# Patient Record
Sex: Female | Born: 1950 | Race: White | Hispanic: No | Marital: Married | Smoking: Never smoker
Health system: Southern US, Community
[De-identification: ages and names within clinical notes are randomized; demographics above are authoritative.]

## PROBLEM LIST (undated history)

## (undated) DIAGNOSIS — M8580 Other specified disorders of bone density and structure, unspecified site: Principal | ICD-10-CM

## (undated) DIAGNOSIS — J302 Other seasonal allergic rhinitis: Secondary | ICD-10-CM

## (undated) DIAGNOSIS — I10 Essential (primary) hypertension: Secondary | ICD-10-CM

## (undated) HISTORY — DX: Essential (primary) hypertension: I10

## (undated) HISTORY — DX: Other specified disorders of bone density and structure, unspecified site: M85.80

---

## 1997-08-22 ENCOUNTER — Other Ambulatory Visit: Admission: RE | Admit: 1997-08-22 | Discharge: 1997-08-22 | Payer: Self-pay | Admitting: Obstetrics and Gynecology

## 2011-02-06 ENCOUNTER — Other Ambulatory Visit (HOSPITAL_COMMUNITY)
Admission: RE | Admit: 2011-02-06 | Discharge: 2011-02-06 | Disposition: A | Discharge status: Home / Self Care | Payer: BC Managed Care – PPO | Source: Ambulatory Visit | Attending: Obstetrics and Gynecology | Admitting: Obstetrics and Gynecology

## 2011-02-06 ENCOUNTER — Other Ambulatory Visit: Payer: Self-pay | Admitting: Adult Health

## 2011-02-06 DIAGNOSIS — Z780 Asymptomatic menopausal state: Secondary | ICD-10-CM

## 2011-02-06 DIAGNOSIS — Z01419 Encounter for gynecological examination (general) (routine) without abnormal findings: Secondary | ICD-10-CM | POA: Insufficient documentation

## 2011-02-06 DIAGNOSIS — Z139 Encounter for screening, unspecified: Secondary | ICD-10-CM

## 2011-02-11 ENCOUNTER — Other Ambulatory Visit (HOSPITAL_COMMUNITY): Payer: Self-pay

## 2011-02-11 ENCOUNTER — Ambulatory Visit (HOSPITAL_COMMUNITY)
Admission: RE | Admit: 2011-02-11 | Discharge: 2011-02-11 | Disposition: A | Discharge status: Home / Self Care | Payer: BC Managed Care – PPO | Source: Ambulatory Visit | Attending: Adult Health | Admitting: Adult Health

## 2011-02-11 DIAGNOSIS — Z13820 Encounter for screening for osteoporosis: Secondary | ICD-10-CM | POA: Insufficient documentation

## 2011-02-11 DIAGNOSIS — Z780 Asymptomatic menopausal state: Secondary | ICD-10-CM

## 2011-02-11 DIAGNOSIS — Z139 Encounter for screening, unspecified: Secondary | ICD-10-CM

## 2011-03-25 ENCOUNTER — Encounter (HOSPITAL_COMMUNITY): Payer: Self-pay | Admitting: Pharmacy Technician

## 2011-03-26 ENCOUNTER — Encounter (HOSPITAL_COMMUNITY)
Admission: RE | Admit: 2011-03-26 | Discharge: 2011-03-26 | Disposition: A | Discharge status: Home / Self Care | Payer: BC Managed Care – PPO | Source: Ambulatory Visit | Attending: Podiatry | Admitting: Podiatry

## 2011-03-26 ENCOUNTER — Encounter (HOSPITAL_COMMUNITY): Payer: Self-pay

## 2011-03-26 ENCOUNTER — Other Ambulatory Visit: Payer: Self-pay

## 2011-03-26 HISTORY — DX: Other seasonal allergic rhinitis: J30.2

## 2011-03-26 LAB — BASIC METABOLIC PANEL
BUN: 17 mg/dL (ref 6–23)
Creatinine, Ser: 0.91 mg/dL (ref 0.50–1.10)
GFR calc Af Amer: 77 mL/min — ABNORMAL LOW (ref >90)
GFR calc non Af Amer: 66 mL/min — ABNORMAL LOW (ref >90)

## 2011-03-26 LAB — CBC
HCT: 43.4 % (ref 36.0–46.0)
Hemoglobin: 14.7 g/dL (ref 12.0–15.0)
MCH: 30.6 pg (ref 26.0–34.0)
MCHC: 33.9 g/dL (ref 30.0–36.0)
MCV: 90.2 fL (ref 78.0–100.0)

## 2011-03-26 LAB — DIFFERENTIAL
Basophils Relative: 1 % (ref 0–1)
Eosinophils Absolute: 0.1 10*3/uL (ref 0.0–0.7)
Eosinophils Relative: 2 % (ref 0–5)
Monocytes Absolute: 0.3 10*3/uL (ref 0.1–1.0)
Monocytes Relative: 5 % (ref 3–12)

## 2011-03-26 LAB — SURGICAL PCR SCREEN: MRSA, PCR: NEGATIVE

## 2011-03-26 NOTE — Patient Instructions (Signed)
[address] Rose Palmer  03/26/2011   Your procedure is scheduled on:  3.21.13  Report to [facility] at [place] AM.  Call this number if you have problems the morning of surgery: [phone]   Remember:   Do not eat food:After Midnight.  May have clear liquids:until Midnight .  Clear liquids include soda, tea, black coffee, apple or grape juice, broth.  Take these medicines the morning of surgery with A SIP OF WATER: ativan if needed   Do not wear jewelry, make-up or nail polish.  Do not wear lotions, powders, or perfumes. You may wear deodorant.  Do not shave 48 hours prior to surgery.  Do not bring valuables to the hospital.  Contacts, dentures or bridgework may not be worn into surgery.  Leave suitcase in the car. After surgery it may be brought to your room.  For patients admitted to the hospital, checkout time is 11:00 AM the day of discharge.   Patients discharged the day of surgery will not be allowed to drive home.  Name and phone number of your driver: family  Special Instructions: CHG Shower Use Special Wash: 1/2 bottle night before surgery and 1/2 bottle morning of surgery.   Please read over the following fact sheets that you were given: Pain Booklet, MRSA Information, Surgical Site Infection Prevention, Anesthesia Post-op Instructions and Care and Recovery After Surgery   PATIENT INSTRUCTIONS POST-ANESTHESIA  IMMEDIATELY FOLLOWING SURGERY:  Do not drive or operate machinery for the first twenty four hours after surgery.  Do not make any important decisions for twenty four hours after surgery or while taking narcotic pain medications or sedatives.  If you develop intractable nausea and vomiting or a severe headache please notify your doctor immediately.  FOLLOW-UP:  Please make an appointment with your surgeon as instructed. You do not need to follow up with anesthesia unless specifically instructed to do so.  WOUND CARE INSTRUCTIONS (if applicable):  Keep a dry clean dressing on the  anesthesia/puncture wound site if there is drainage.  Once the wound has quit draining you may leave it open to air.  Generally you should leave the bandage intact for twenty four hours unless there is drainage.  If the epidural site drains for more than 36-48 hours please call the anesthesia department.  QUESTIONS?:  Please feel free to call your physician or the hospital operator if you have any questions, and they will be happy to assist you.     [facility] Anesthesia Department [address] [place] [phone]

## 2011-03-27 NOTE — H&P (Signed)
Chief Complaint / History of Present Illness: This 61 year old female returns today for a consultation to discusses the proposed surgery.  She is scheduled for bunionectomy of the left foot, Weil osteotomy of the 2nd metatarsal bone of the left foot and hammertoe repair of the 2nd digit of the left foot on 03/28/2011 at [facility].  Past Medical History:  Dermatologic Hx: (+) granuloma annulare.   Musculoskeletal Hx: (+) osteopenia.   Medication History: Active: calcium (active), Aspirin 81 mg (active), fish oil (active).   Allergies: No known medical allergies Past Surgical History: No previous surgeries. Social History: Patient/Guardian admits alcohol use. Drinking is described as social, Patient/Guardian denies illegal drug use, Patient/Guardian denies tobacco use.   Family History: Patient/Guardian admits a family history of diabetes associated with father, paternal grandmother, heart attack associated with mother, CHF associated with father.   Review of Systems: No abnormal findings with the exception of the chief complaint.    Physical Exam: The patient is a pleasant, 61 year old female in no apparent distress.  She is alert and oriented in no acute distress.  Skin of both feet is warm dry and supple.  No open lesions are present.  Nails are well trimmed.  Dorsalis pedis and posterior tibial pulses are palpable bilaterally.  No edema is present.  Capillary fill is brisk.  Muscle tone is normal.  Muscle strength is 5 out of 5 with regard to dorsiflexion plantar flexion inversion eversion of both feet.  There is lateral deviation of both great toes with dorsal medial prominence first metatarsal head bilaterally.  There is  lateral deviation of the second digit bilaterally.  First metatarsophalangeal joint range of motion is decreased with regards to dorsiflexion bilaterally.  There is pain with palpation on the plantar aspect of the second metatarsal head bilaterally.  Light touch sensation  is grossly intact.  Test Results:  None to report at this time.    Impression:  Hallux valgus.  Hammertoe deformity.  Elongated second metatarsal.  Plan:   The podiatric pathology and treatment options were again reviewed with the her.  I discussed with the her the surgical procedure itself, the indications, the risks, possible complications, post-operative course and alternative treatments. I gave no guarantees regarding the outcome. She would like to proceed with the proposed surgery.  An informed consent was obtained.  She is to return for her post-operative appointment or sooner if problems arise.  Prescriptions: Rx: Phenergan- 12.5 mg tablet , Take 1 tablet by mouth every four to six hours as needed. Max daily dose: 6.  Dispense: 21 tablet. Refills: 1.  Allow Generic: Yes Rx: Percocet- 325 mg-10mg  tablet , Take 1 tablet by mouth every six hours as needed for pain. Max daily dose: 4.  Dispense: 56 tablet. Refills: 0.  Allow Generic: Yes  cc: Primary Care Physician:  Dwana Melena, MD

## 2011-03-28 ENCOUNTER — Encounter (HOSPITAL_COMMUNITY)
Admission: RE | Disposition: A | Discharge status: Home / Self Care | Payer: Self-pay | Source: Ambulatory Visit | Attending: Podiatry

## 2011-03-28 ENCOUNTER — Ambulatory Visit (HOSPITAL_COMMUNITY): Payer: BC Managed Care – PPO

## 2011-03-28 ENCOUNTER — Ambulatory Visit (HOSPITAL_COMMUNITY)
Admission: RE | Admit: 2011-03-28 | Discharge: 2011-03-28 | Disposition: A | Discharge status: Home / Self Care | Payer: BC Managed Care – PPO | Source: Ambulatory Visit | Attending: Podiatry | Admitting: Podiatry

## 2011-03-28 ENCOUNTER — Encounter (HOSPITAL_COMMUNITY): Payer: Self-pay | Admitting: Anesthesiology

## 2011-03-28 ENCOUNTER — Ambulatory Visit (HOSPITAL_COMMUNITY): Payer: BC Managed Care – PPO | Admitting: Anesthesiology

## 2011-03-28 DIAGNOSIS — Z01810 Encounter for preprocedural cardiovascular examination: Secondary | ICD-10-CM | POA: Insufficient documentation

## 2011-03-28 DIAGNOSIS — M2040 Other hammer toe(s) (acquired), unspecified foot: Secondary | ICD-10-CM | POA: Insufficient documentation

## 2011-03-28 DIAGNOSIS — Q799 Congenital malformation of musculoskeletal system, unspecified: Secondary | ICD-10-CM | POA: Insufficient documentation

## 2011-03-28 DIAGNOSIS — Z01812 Encounter for preprocedural laboratory examination: Secondary | ICD-10-CM | POA: Insufficient documentation

## 2011-03-28 DIAGNOSIS — M2010 Hallux valgus (acquired), unspecified foot: Secondary | ICD-10-CM | POA: Insufficient documentation

## 2011-03-28 HISTORY — PX: BUNIONECTOMY: SHX129

## 2011-03-28 HISTORY — PX: HAMMER TOE SURGERY: SHX385

## 2011-03-28 HISTORY — PX: WEIL OSTEOTOMY: SHX5044

## 2011-03-28 SURGERY — BUNIONECTOMY
Anesthesia: Monitor Anesthesia Care | Site: Foot | Laterality: Left | Wound class: Clean

## 2011-03-28 MED ORDER — ONDANSETRON HCL 4 MG/2ML IJ SOLN: 4.0000 mg | Freq: Once | INTRAMUSCULAR | Status: DC | PRN

## 2011-03-28 MED ORDER — MIDAZOLAM HCL 2 MG/2ML IJ SOLN
INTRAMUSCULAR | Status: AC
  Administered 2011-03-28: 2 mg via INTRAVENOUS
  Filled 2011-03-28: qty 2

## 2011-03-28 MED ORDER — MIDAZOLAM HCL 5 MG/5ML IJ SOLN
INTRAMUSCULAR | Status: DC | PRN
  Administered 2011-03-28: 2 mg via INTRAVENOUS

## 2011-03-28 MED ORDER — CEFAZOLIN SODIUM 1-5 GM-% IV SOLN: 1.0000 g | INTRAVENOUS | Status: DC

## 2011-03-28 MED ORDER — CEFAZOLIN SODIUM 1-5 GM-% IV SOLN
INTRAVENOUS | Status: DC | PRN
  Administered 2011-03-28: 1 g via INTRAVENOUS

## 2011-03-28 MED ORDER — MIDAZOLAM HCL 2 MG/2ML IJ SOLN
INTRAMUSCULAR | Status: AC
  Filled 2011-03-28: qty 2

## 2011-03-28 MED ORDER — MIDAZOLAM HCL 2 MG/2ML IJ SOLN
1.0000 mg | INTRAMUSCULAR | Status: DC | PRN
  Administered 2011-03-28: 2 mg via INTRAVENOUS

## 2011-03-28 MED ORDER — PROPOFOL 10 MG/ML IV EMUL
INTRAVENOUS | Status: AC
  Filled 2011-03-28: qty 20

## 2011-03-28 MED ORDER — FENTANYL CITRATE 0.05 MG/ML IJ SOLN
INTRAMUSCULAR | Status: DC | PRN
  Administered 2011-03-28 (×2): 25 ug via INTRAVENOUS
  Administered 2011-03-28: 50 ug via INTRAVENOUS

## 2011-03-28 MED ORDER — BUPIVACAINE HCL (PF) 0.5 % IJ SOLN
INTRAMUSCULAR | Status: AC
  Filled 2011-03-28: qty 30

## 2011-03-28 MED ORDER — LIDOCAINE HCL (PF) 1 % IJ SOLN
INTRAMUSCULAR | Status: AC
  Filled 2011-03-28: qty 5

## 2011-03-28 MED ORDER — FENTANYL CITRATE 0.05 MG/ML IJ SOLN: 25.0000 ug | INTRAMUSCULAR | Status: DC | PRN

## 2011-03-28 MED ORDER — FENTANYL CITRATE 0.05 MG/ML IJ SOLN
INTRAMUSCULAR | Status: AC
  Filled 2011-03-28: qty 2

## 2011-03-28 MED ORDER — PROPOFOL 10 MG/ML IV BOLUS
INTRAVENOUS | Status: DC | PRN
  Administered 2011-03-28: 20 mg via INTRAVENOUS
  Administered 2011-03-28 (×2): 10 mg via INTRAVENOUS

## 2011-03-28 MED ORDER — SODIUM CHLORIDE 0.9 % IR SOLN
Status: DC | PRN
  Administered 2011-03-28: 1000 mL

## 2011-03-28 MED ORDER — LACTATED RINGERS IV SOLN
INTRAVENOUS | Status: DC
  Administered 2011-03-28: 07:00:00 via INTRAVENOUS

## 2011-03-28 MED ORDER — BUPIVACAINE HCL (PF) 0.5 % IJ SOLN
INTRAMUSCULAR | Status: DC | PRN
  Administered 2011-03-28: 29 mL

## 2011-03-28 MED ORDER — CEFAZOLIN SODIUM 1-5 GM-% IV SOLN
INTRAVENOUS | Status: AC
  Filled 2011-03-28: qty 50

## 2011-03-28 MED ORDER — PROPOFOL 10 MG/ML IV EMUL
INTRAVENOUS | Status: DC | PRN
  Administered 2011-03-28: 50 ug/kg/min via INTRAVENOUS

## 2011-03-28 SURGICAL SUPPLY — 67 items
APL SKNCLS STERI-STRIP NONHPOA (GAUZE/BANDAGES/DRESSINGS) ×1
BAG HAMPER (MISCELLANEOUS) ×2 IMPLANT
BANDAGE CONFORM 2  STR LF (GAUZE/BANDAGES/DRESSINGS) ×1 IMPLANT
BANDAGE ELASTIC 3 VELCRO NS (GAUZE/BANDAGES/DRESSINGS) ×1 IMPLANT
BANDAGE ELASTIC 4 VELCRO NS (GAUZE/BANDAGES/DRESSINGS) ×2 IMPLANT
BANDAGE ESMARK 4X12 BL STRL LF (DISPOSABLE) ×1 IMPLANT
BANDAGE GAUZE ELAST BULKY 4 IN (GAUZE/BANDAGES/DRESSINGS) ×2 IMPLANT
BENZOIN TINCTURE PRP APPL 2/3 (GAUZE/BANDAGES/DRESSINGS) ×2 IMPLANT
BIT DRILL 2.0 HCS 150 (BIT) IMPLANT
BIT DRILL MICR ACTRK 2 LNG PRF (BIT) IMPLANT
BLADE AVERAGE 25X9 (BLADE) ×2 IMPLANT
BLADE OSC/SAG 18.5X9 THN (BLADE) ×2 IMPLANT
BLADE OSC/SAGITTAL MD 5.5X18 (BLADE) ×2 IMPLANT
BLADE SURG 15 STRL LF DISP TIS (BLADE) ×2 IMPLANT
BLADE SURG 15 STRL SS (BLADE) ×4
BNDG CMPR 12X4 ELC STRL LF (DISPOSABLE) ×1
BNDG ESMARK 4X12 BLUE STRL LF (DISPOSABLE) ×2
CHLORAPREP W/TINT 26ML (MISCELLANEOUS) ×2 IMPLANT
CLOTH BEACON ORANGE TIMEOUT ST (SAFETY) ×2 IMPLANT
COVER SURGICAL LIGHT HANDLE (MISCELLANEOUS) ×4 IMPLANT
CUFF TOURNIQUET SINGLE 18IN (TOURNIQUET CUFF) ×1 IMPLANT
DECANTER SPIKE VIAL GLASS SM (MISCELLANEOUS) ×2 IMPLANT
DRAPE OEC MINIVIEW 54X84 (DRAPES) ×2 IMPLANT
DRILL MICRO ACUTRAK 2 LNG PROF (BIT) ×2
DRSG ADAPTIC 3X8 NADH LF (GAUZE/BANDAGES/DRESSINGS) ×2 IMPLANT
DURA STEPPER LG (CAST SUPPLIES) IMPLANT
DURA STEPPER MED (CAST SUPPLIES) ×1 IMPLANT
DURA STEPPER SML (CAST SUPPLIES) IMPLANT
DURA STEPPER XL (SOFTGOODS) IMPLANT
ELECT REM PT RETURN 9FT ADLT (ELECTROSURGICAL) ×2
ELECTRODE REM PT RTRN 9FT ADLT (ELECTROSURGICAL) ×1 IMPLANT
GLOVE BIO SURGEON STRL SZ7.5 (GLOVE) ×2 IMPLANT
GLOVE ECLIPSE 6.5 STRL STRAW (GLOVE) ×1 IMPLANT
GLOVE ECLIPSE 7.0 STRL STRAW (GLOVE) ×1 IMPLANT
GLOVE EXAM NITRILE MD LF STRL (GLOVE) ×1 IMPLANT
GLOVE INDICATOR 7.0 STRL GRN (GLOVE) ×1 IMPLANT
GOWN STRL REIN XL XLG (GOWN DISPOSABLE) ×6 IMPLANT
GUIDEWIRE ORTHO MICROSHT  ACUT (WIRE) ×1
GUIDEWIRE ORTHO MICROSHT .035 (WIRE) IMPLANT
K-WIRE 6 (WIRE)
K-WIRE DBL PT .062 (WIRE) ×1 IMPLANT
KIT ROOM TURNOVER AP CYSTO (KITS) ×2 IMPLANT
KIT ROOM TURNOVER APOR (KITS) ×2 IMPLANT
KWIRE 6 (WIRE) IMPLANT
MANIFOLD NEPTUNE II (INSTRUMENTS) ×2 IMPLANT
NDL HYPO 27GX1-1/4 (NEEDLE) ×3 IMPLANT
NEEDLE HYPO 27GX1-1/4 (NEEDLE) ×6 IMPLANT
NS IRRIG 1000ML POUR BTL (IV SOLUTION) ×2 IMPLANT
PACK BASIC LIMB (CUSTOM PROCEDURE TRAY) ×2 IMPLANT
PAD ARMBOARD 7.5X6 YLW CONV (MISCELLANEOUS) ×2 IMPLANT
PAIN PUMP ON-Q 100MLX2ML 2.5IN (PAIN MANAGEMENT) IMPLANT
PIN CAPS ORTHO GREEN .062 (PIN) IMPLANT
RASP SM TEAR CROSS CUT (RASP) ×1 IMPLANT
SCREW BONE ACUTRAK 2 MICRO 20M (Orthopedic Implant) ×1 IMPLANT
SCREW LOCKING 2.0X10 (Screw) ×1 IMPLANT
SET BASIN LINEN APH (SET/KITS/TRAYS/PACK) ×2 IMPLANT
SPONGE GAUZE 4X4 12PLY (GAUZE/BANDAGES/DRESSINGS) ×2 IMPLANT
STRIP CLOSURE SKIN 1/2X4 (GAUZE/BANDAGES/DRESSINGS) ×3 IMPLANT
STRIP CLOSURE SKIN 1/8X3 (GAUZE/BANDAGES/DRESSINGS) ×6 IMPLANT
SUT MON AB 5-0 PS2 18 (SUTURE) ×2 IMPLANT
SUT PROLENE 4 0 PS 2 18 (SUTURE) ×1 IMPLANT
SUT VIC AB 2-0 CT2 27 (SUTURE) ×1 IMPLANT
SUT VIC AB 4-0 PS2 27 (SUTURE) ×2 IMPLANT
SUT VICRYL AB 3-0 FS1 BRD 27IN (SUTURE) ×2 IMPLANT
SYR BULB IRRIGATION 50ML (SYRINGE) ×2 IMPLANT
SYR CONTROL 10ML LL (SYRINGE) ×5 IMPLANT
TOWEL OR 17X26 4PK STRL BLUE (TOWEL DISPOSABLE) ×1 IMPLANT

## 2011-03-28 NOTE — Brief Op Note (Signed)
OPERATIVE REPORT  SURGEON:   Dallas Schimke, DPM  OR STAFF:   Cyndie Chime, RN - Circulator Maggie Lucile Crater, CST - Scrub Person Lennox Pippins, RN - RN First Assistant   PREOPERATIVE DIAGNOSIS:   1. Hallux valgus left foot 2. Elongated / plantarflexed 2nd metatarsal left foot 3. Hammertoe deformity 2nd digit left foot  POSTOPERATIVE DIAGNOSIS: 1. Hallux valgus left foot 2. Elongated / plantarflexed 2nd metatarsal left foot 3. Hammertoe deformity 2nd digit left foot  PROCEDURE: 1. Austin bunionectomy left foot 2. Weil osteotomy 2nd metatarsal left foot  ANESTHESIA:  Monitor Anesthesia Care   HEMOSTASIS:    Total Tourniquet Time Documented: Calf (Left) - 92 minutes; Tourniquet set at 250 mmHg  ESTIMATED BLOOD LOSS:   Minimal (<5cc)  MATERIALS USED:  Accumed Micro Acutrak 2 Bone Screw x 20mm Synthes Screw 2.0 x 10mm   INJECTABLES: Marcaine 0.5% plain; 30mL  PATHOLOGY:   None  COMPLICATIONS:   None  INDICATIONS:  Painful bunion deformity of the left foot and elongated 2nd metatarsal of the left foot  DICTATION:  Other Dictation: Dictation Number [id number]

## 2011-03-28 NOTE — Anesthesia Postprocedure Evaluation (Signed)
  Anesthesia Post-op Note  Patient: Rose Palmer  Procedure(s) Performed: Procedure(s) (LRB): BUNIONECTOMY (Left) WEIL OSTEOTOMY (Left) HAMMER TOE CORRECTION (Left)  Patient Location: PACU  Anesthesia Type: MAC  Level of Consciousness: awake, alert  and oriented  Airway and Oxygen Therapy: Patient Spontanous Breathing  Post-op Pain: none  Post-op Assessment: Post-op Vital signs reviewed, Patient's Cardiovascular Status Stable, Respiratory Function Stable and Patent Airway  Post-op Vital Signs: Reviewed and stable  Complications: No apparent anesthesia complications

## 2011-03-28 NOTE — Anesthesia Preprocedure Evaluation (Signed)
Anesthesia Evaluation  Patient identified by MRN, date of birth, ID band Patient awake    Reviewed: Allergy & Precautions, H&P , NPO status , Patient's Chart, lab work & pertinent test results  History of Anesthesia Complications Negative for: history of anesthetic complications  Airway Mallampati: II      Dental  (+) Teeth Intact   Pulmonary neg pulmonary ROS,  breath sounds clear to auscultation        Cardiovascular negative cardio ROS  Rhythm:Regular Rate:Normal     Neuro/Psych    GI/Hepatic negative GI ROS,   Endo/Other    Renal/GU      Musculoskeletal   Abdominal   Peds  Hematology   Anesthesia Other Findings   Reproductive/Obstetrics                           Anesthesia Physical Anesthesia Plan  ASA: I  Anesthesia Plan: MAC   Post-op Pain Management:    Induction: Intravenous  Airway Management Planned: Nasal Cannula  Additional Equipment:   Intra-op Plan:   Post-operative Plan:   Informed Consent: I have reviewed the patients History and Physical, chart, labs and discussed the procedure including the risks, benefits and alternatives for the proposed anesthesia with the patient or authorized representative who has indicated his/her understanding and acceptance.     Plan Discussed with:   Anesthesia Plan Comments:         Anesthesia Quick Evaluation

## 2011-03-28 NOTE — Transfer of Care (Signed)
Immediate Anesthesia Transfer of Care Note  Patient: Rose Palmer  Procedure(s) Performed: Procedure(s) (LRB): BUNIONECTOMY (Left) WEIL OSTEOTOMY (Left) HAMMER TOE CORRECTION (Left)  Patient Location: PACU  Anesthesia Type: MAC  Level of Consciousness: awake, alert  and oriented  Airway & Oxygen Therapy: Patient Spontanous Breathing  Post-op Assessment: Report given to PACU RN  Post vital signs: Reviewed and stable  Complications: No apparent anesthesia complications

## 2011-03-28 NOTE — H&P (Signed)
HISTORY AND PHYSICAL INTERVAL NOTE:  03/28/2011  7:25 AM  Rose Palmer  has presented today for surgery, with the diagnosis of hallux valgus, elongated / plantarflexed 2nd metatarsal left foot.  The various methods of treatment have been discussed with the patient.  No guarantees were given.  After consideration of risks, benefits and other options for treatment, the patient has consented to surgery.  I have reviewed the patients' chart and labs.    Patient Vitals for the past 24 hrs:  BP Temp Temp src Resp SpO2  03/28/11 0715 128/85 mmHg - - 15  98 %  03/28/11 0700 129/87 mmHg - - 26  98 %  03/28/11 0647 128/81 mmHg 98.2 F (36.8 C) Oral 17  100 %    A history and physical examination was performed in my office on 03/26/2011.  The patient was reexamined.  There have been no changes to this history and physical examination.  Dallas Schimke, DPM

## 2011-03-28 NOTE — Addendum Note (Signed)
Addendum  created 03/28/11 0948 by Franco Nones, CRNA   Modules edited:Charges VN

## 2011-03-30 NOTE — Op Note (Signed)
NAMEPRIYAL, Rose Palmer                   ACCOUNT NO.:  [id number]  MEDICAL RECORD NO.:  [id number]  LOCATION:  [facility]                          FACILITY:  [facility]  PHYSICIAN:  B. Theola Sequin, MD   DATE OF BIRTH:  1950/10/05  DATE OF PROCEDURE: DATE OF DISCHARGE:  03/28/2011                              OPERATIVE REPORT   SURGEON:  B. Theola Sequin, MD  ASSISTANT:  Valetta Close, RN  PREOPERATIVE DIAGNOSES: 1. Hallux valgus, left foot. 2. Elongated/plantar flexed second metatarsal, left foot. 3. Hammertoe deformity, second digit, left foot.  POSTOPERATIVE DIAGNOSES: 1. Hallux valgus, left foot. 2. Elongated/plantar flexed second metatarsal, left foot. 3. Hammertoe deformity, second digit, left foot.  PROCEDURE: 1. Austin bunionectomy, left foot. 2. Weil osteotomy, second metatarsal, left foot.  ANESTHESIA:  MAC with local.  HEMOSTASIS:  Pneumatic ankle tourniquet at 250 mmHg.  ESTIMATED BLOOD LOSS:  Minimal (less than 5 mL, injectable 0.5% Marcaine plain).  PATHOLOGY:  None.  COMPLICATIONS:  None.  PROCEDURE IN DETAIL:  The patient was brought to the operating room and placed on the operative table in the supine position.  The pneumatic ankle tourniquet was placed about the patient's left ankle.  The foot was then anesthetized using 0.5% Marcaine plain.  The foot was scrubbed, prepped and draped in the usual sterile manner.  The limb was then elevated, exsanguinated and the pneumatic ankle tourniquet inflated to 250 mmHg.  Attention was directed to the dorsomedial aspect of the left foot, where a linear longitudinal incision was made medial and parallel to the extensor hallucis longus tendon and involved the contour of the deformity.  The incision was deepened through the subcutaneous tissue down to the level of the first metatarsophalangeal joint.  A linear longitudinal periosteal and capsular incision was then performed.  The periosteal and capsular structures were  reflected medially and laterally, thus exposing the head of the first metatarsal at the operative site.  The prominent medial eminence of the first metatarsal head was resected.  Attention was directed to the first interspace via the original skin incision, where the tendon of the adductor hallucis muscle was identified and transected at its attachment to the base of the proximal phalanx.  Attention was redirected to the medial aspect of the first metatarsal head, where a chevron osteotomy was performed through the first metatarsal bone, the capital fragment was distracted and shifted into more corrected lateral position and impacted upon the first metatarsal shaft.  Following standard principles and techniques, an Acumed micro Acutrak 2 bone screw, 20 mm in length was inserted across the osteotomy site with adequate compression noted.  Position of the screw was confirmed with C-arm fluoroscopy.  The remaining medial bone shelf was resected and passed from the operative field.  The medial sesamoid was noted to be extremely prominent medially after translation of the capital fragment and it was determined that this should be removed to avoid postoperative complications.  The tibial sesamoid was freed of its soft tissue attachments and removed and passed from the operative field.  The wound was irrigated with copious amount of sterile irrigant.  The periosteal and capsular structure reapproximated using 3- 0  Vicryl and medial capsulorrhaphy was performed using 2-0 Vicryl. Subcutaneous structure reapproximated using 4-0 Vicryl and skin was reapproximated using 5-0 Monocryl in a running, subcuticular manner and reinforced with Prolene and Steri-Strips.  Attention was then directed to the dorsal aspect of the second toe, where a linear longitudinal incision was made.  Dissection was continued deep down to level of the second metatarsophalangeal joint.  The linear longitudinal periosteal  capsular incision was then performed.  The periosteal and capsular structure reflected medially and laterally thus exposing the head of the second metatarsal at the operative site.  A Weil osteotomy was performed and fixated using a 2.0 Synthes screw.  The wound was irrigated with copious amounts of sterile irrigant.  The periosteal capsular structure reapproximated using 4-0 Vicryl.  Skin was reapproximated using 5-0 Monocryl in a running subcuticular manner, reinforced with 4-0 Prolene and Steri-Strips.  Sterile compressive dressing was then applied to the operative foot.  The pneumatic ankle tourniquet was deflated and a prompt hyperemic response was noted to all digits of the operative foot.          ______________________________ B. Theola Sequin, MD     BIM/MEDQ  D:  03/29/2011  T:  03/30/2011  Job:  [id number]

## 2011-04-19 ENCOUNTER — Encounter (HOSPITAL_COMMUNITY): Payer: Self-pay | Admitting: Podiatry

## 2012-02-24 ENCOUNTER — Other Ambulatory Visit: Payer: Self-pay | Admitting: Adult Health

## 2012-02-24 ENCOUNTER — Other Ambulatory Visit (HOSPITAL_COMMUNITY)
Admission: RE | Admit: 2012-02-24 | Discharge: 2012-02-24 | Disposition: A | Discharge status: Home / Self Care | Payer: BC Managed Care – PPO | Source: Ambulatory Visit | Attending: Obstetrics and Gynecology | Admitting: Obstetrics and Gynecology

## 2012-02-24 DIAGNOSIS — Z1151 Encounter for screening for human papillomavirus (HPV): Secondary | ICD-10-CM | POA: Insufficient documentation

## 2012-02-24 DIAGNOSIS — Z01419 Encounter for gynecological examination (general) (routine) without abnormal findings: Secondary | ICD-10-CM | POA: Insufficient documentation

## 2013-01-25 IMAGING — CR DG FOOT COMPLETE 3+V*L*
3 series · 3 of 3 positions shown · non-contrast
Comparison: None.

CLINICAL DATA: Postop bunionectomy and osteotomy.

LEFT FOOT - COMPLETE 3+ VIEW

[view not recorded (1 of 3)]
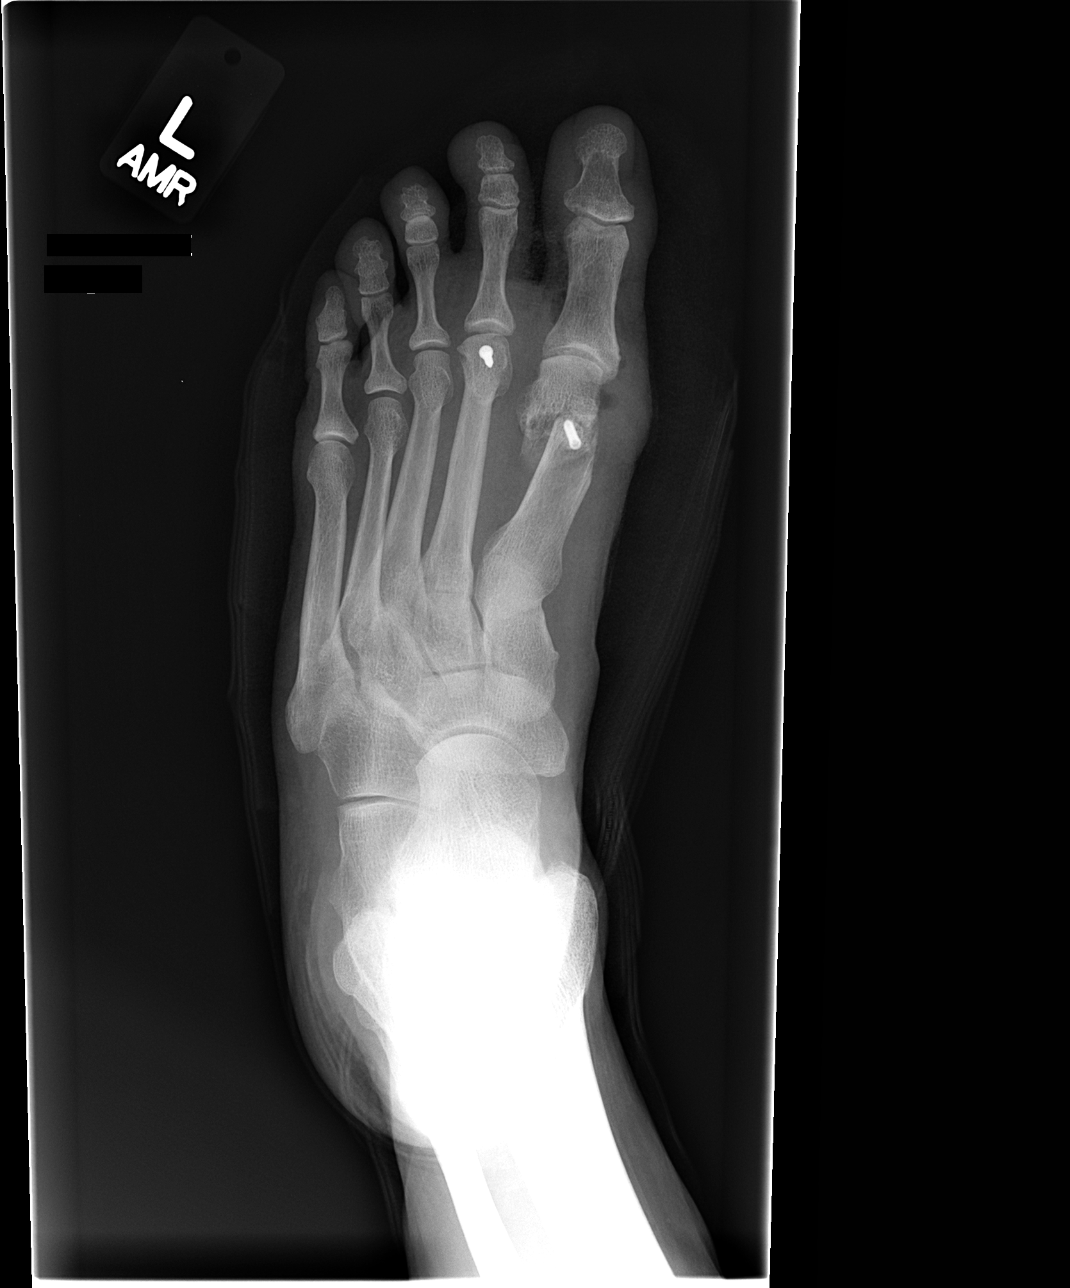

[view not recorded (2 of 3)]
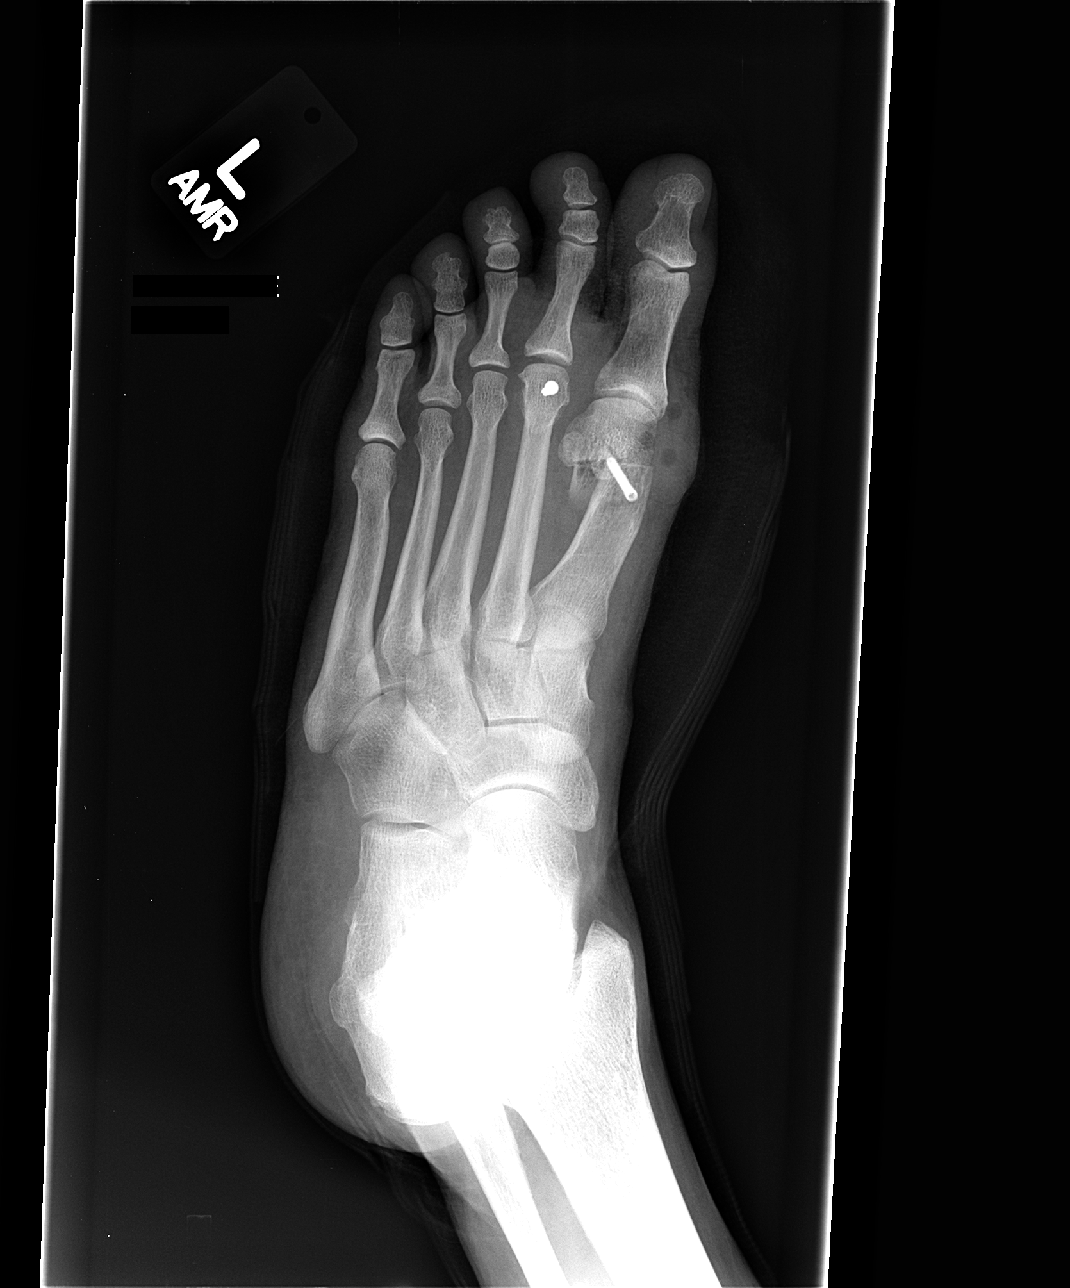

[view not recorded (3 of 3)]
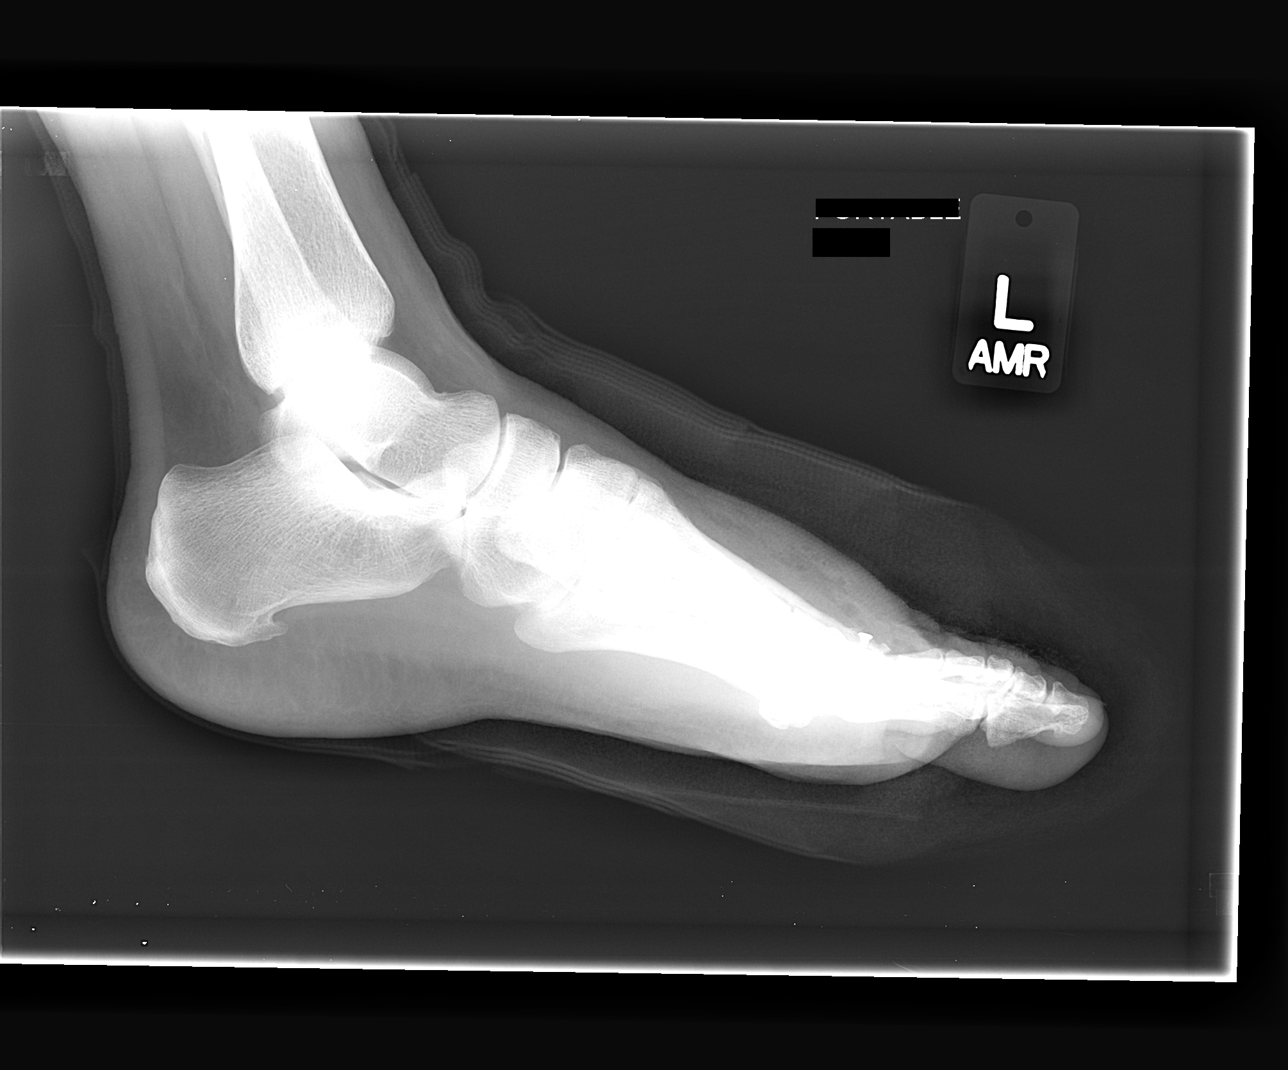

[3 of 3 positions shown; findings below may reference images not displayed]

FINDINGS: Postoperative changes are seen in the distal first
metatarsal, with surrounding soft tissue edema and subcutaneous
emphysema. Mild first metatarsal phalangeal joint osteoarthritis.
Postoperative changes in the head of the second metatarsal appear
remote.  Calcaneal spur is noted.
IMPRESSION: 1.  Postoperative changes in the first metatarsal with expected
postoperative findings of soft tissue swelling and subcutaneous
emphysema.
2.  Postoperative changes in the second metatarsal head appear
remote.
3.  Mild first metatarsal phalangeal joint osteoarthritis.

## 2013-03-02 ENCOUNTER — Other Ambulatory Visit: Payer: Self-pay | Admitting: Adult Health

## 2013-04-01 ENCOUNTER — Encounter (INDEPENDENT_AMBULATORY_CARE_PROVIDER_SITE_OTHER): Payer: Self-pay

## 2013-04-01 ENCOUNTER — Ambulatory Visit (INDEPENDENT_AMBULATORY_CARE_PROVIDER_SITE_OTHER): Payer: BC Managed Care – PPO | Admitting: Adult Health

## 2013-04-01 ENCOUNTER — Encounter: Payer: Self-pay | Admitting: Adult Health

## 2013-04-01 VITALS — BP 120/80 | HR 72 | Ht 65.0 in | Wt 121.0 lb

## 2013-04-01 DIAGNOSIS — Z01419 Encounter for gynecological examination (general) (routine) without abnormal findings: Secondary | ICD-10-CM

## 2013-04-01 DIAGNOSIS — Z1212 Encounter for screening for malignant neoplasm of rectum: Secondary | ICD-10-CM

## 2013-04-01 LAB — HEMOCCULT GUIAC POC 1CARD (OFFICE): Fecal Occult Blood, POC: NEGATIVE

## 2013-04-01 NOTE — Progress Notes (Signed)
Patient ID: Rose Palmer, female   DOB: Jun 12, 1950, 63 y.o.   MRN: [id number] History of Present Illness: Rose Palmer is a 63 year old white female, married in for a physical.No complaints.Had normal pap with negative HPV 02/24/12.   Current Medications, Allergies, Past Medical History, Past Surgical History, Family History and Social History were reviewed in [employer] record.     Review of Systems: Patient denies any headaches, blurred vision, shortness of breath, chest pain, abdominal pain, problems with bowel movements, urination, or intercourse. No joint pain or mood swings.    Physical Exam:BP 120/80  Pulse 72  Ht 5\' 5"  (1.651 m)  Wt 121 lb (54.885 kg)  BMI 20.14 kg/m2 General:  Well developed, well nourished, no acute distress Skin:  Warm and dry Neck:  Midline trachea, normal thyroid Lungs; Clear to auscultation bilaterally Breast:  No dominant palpable mass, retraction, or nipple discharge Cardiovascular: Regular rate and rhythm Abdomen:  Soft, non tender, no hepatosplenomegaly Pelvic:  External genitalia is normal in appearance.  The vagina is normal in appearance.  The cervix is atrophic.  Uterus is felt to be normal size, shape, and contour.  No adnexal masses or tenderness noted. Rectal: Good sphincter tone, no polyps, or hemorrhoids felt.  Hemoccult negative. Extremities:  No swelling or varicosities noted Psych:  No mood changes, alert and cooperative, seems happy   Impression: Yearly gyn exam in PM female    Plan: Physical in 1 year Mammogram yearly Colonoscopy per GI Labs with PCP Get pneumonia and shingles shots-PCP

## 2013-04-01 NOTE — Patient Instructions (Signed)
Physical in 1 year Mammogram yearly  colonoscopy as per GI Labs with PCP Get pneumonia shot and shingles shot

## 2013-05-26 DIAGNOSIS — H524 Presbyopia: Secondary | ICD-10-CM | POA: Diagnosis not present

## 2013-10-13 DIAGNOSIS — Z23 Encounter for immunization: Secondary | ICD-10-CM | POA: Diagnosis not present

## 2013-10-15 DIAGNOSIS — I10 Essential (primary) hypertension: Secondary | ICD-10-CM | POA: Diagnosis not present

## 2013-11-08 ENCOUNTER — Encounter: Payer: Self-pay | Admitting: Adult Health

## 2014-03-04 ENCOUNTER — Other Ambulatory Visit: Payer: Self-pay | Admitting: Adult Health

## 2014-04-08 DIAGNOSIS — Z1231 Encounter for screening mammogram for malignant neoplasm of breast: Secondary | ICD-10-CM | POA: Diagnosis not present

## 2014-04-12 DIAGNOSIS — Z1231 Encounter for screening mammogram for malignant neoplasm of breast: Secondary | ICD-10-CM | POA: Diagnosis not present

## 2014-04-12 DIAGNOSIS — R922 Inconclusive mammogram: Secondary | ICD-10-CM | POA: Diagnosis not present

## 2014-05-03 ENCOUNTER — Encounter: Payer: Self-pay | Admitting: Adult Health

## 2014-07-04 ENCOUNTER — Other Ambulatory Visit: Payer: Self-pay

## 2014-10-17 DIAGNOSIS — N6049 Mammary duct ectasia of unspecified breast: Secondary | ICD-10-CM | POA: Diagnosis not present

## 2014-10-19 DIAGNOSIS — Z23 Encounter for immunization: Secondary | ICD-10-CM | POA: Diagnosis not present

## 2014-11-01 ENCOUNTER — Encounter: Payer: Self-pay | Admitting: Adult Health

## 2014-11-01 DIAGNOSIS — H2513 Age-related nuclear cataract, bilateral: Secondary | ICD-10-CM | POA: Diagnosis not present

## 2014-11-16 DIAGNOSIS — Z23 Encounter for immunization: Secondary | ICD-10-CM | POA: Diagnosis not present

## 2015-03-07 ENCOUNTER — Other Ambulatory Visit: Payer: Self-pay | Admitting: Adult Health

## 2015-03-20 ENCOUNTER — Other Ambulatory Visit: Payer: Self-pay | Admitting: Adult Health

## 2015-05-16 DIAGNOSIS — M8589 Other specified disorders of bone density and structure, multiple sites: Secondary | ICD-10-CM | POA: Diagnosis not present

## 2015-05-16 DIAGNOSIS — R922 Inconclusive mammogram: Secondary | ICD-10-CM | POA: Diagnosis not present

## 2015-05-16 DIAGNOSIS — Z09 Encounter for follow-up examination after completed treatment for conditions other than malignant neoplasm: Secondary | ICD-10-CM | POA: Diagnosis not present

## 2015-05-24 ENCOUNTER — Telehealth: Payer: Self-pay | Admitting: Adult Health

## 2015-05-24 ENCOUNTER — Encounter: Payer: Self-pay | Admitting: Adult Health

## 2015-05-24 DIAGNOSIS — M8580 Other specified disorders of bone density and structure, unspecified site: Secondary | ICD-10-CM

## 2015-05-24 HISTORY — DX: Other specified disorders of bone density and structure, unspecified site: M85.80

## 2015-05-24 NOTE — Telephone Encounter (Signed)
Pt aware DEXA showed osteopenia, continue current treatment and repeat DEXA in 2 years, get pap and physical sometime this year, if desired.

## 2015-05-25 ENCOUNTER — Encounter: Payer: Self-pay | Admitting: Adult Health

## 2015-05-25 DIAGNOSIS — I10 Essential (primary) hypertension: Secondary | ICD-10-CM | POA: Diagnosis not present

## 2015-05-30 DIAGNOSIS — M8580 Other specified disorders of bone density and structure, unspecified site: Secondary | ICD-10-CM | POA: Diagnosis not present

## 2015-05-30 DIAGNOSIS — H9313 Tinnitus, bilateral: Secondary | ICD-10-CM | POA: Diagnosis not present

## 2015-05-31 ENCOUNTER — Encounter (INDEPENDENT_AMBULATORY_CARE_PROVIDER_SITE_OTHER): Payer: Self-pay | Admitting: *Deleted

## 2015-09-14 DIAGNOSIS — N390 Urinary tract infection, site not specified: Secondary | ICD-10-CM | POA: Diagnosis not present

## 2015-09-19 ENCOUNTER — Encounter: Payer: Self-pay | Admitting: Adult Health

## 2015-09-19 ENCOUNTER — Ambulatory Visit (INDEPENDENT_AMBULATORY_CARE_PROVIDER_SITE_OTHER): Payer: Medicare Other | Admitting: Adult Health

## 2015-09-19 ENCOUNTER — Other Ambulatory Visit (HOSPITAL_COMMUNITY)
Admission: RE | Admit: 2015-09-19 | Discharge: 2015-09-19 | Disposition: A | Discharge status: Home / Self Care | Payer: Medicare Other | Source: Ambulatory Visit | Attending: Adult Health | Admitting: Adult Health

## 2015-09-19 VITALS — BP 120/80 | HR 74 | Ht 65.0 in | Wt 123.5 lb

## 2015-09-19 DIAGNOSIS — Z01419 Encounter for gynecological examination (general) (routine) without abnormal findings: Secondary | ICD-10-CM

## 2015-09-19 DIAGNOSIS — Z124 Encounter for screening for malignant neoplasm of cervix: Secondary | ICD-10-CM

## 2015-09-19 DIAGNOSIS — Z01411 Encounter for gynecological examination (general) (routine) with abnormal findings: Secondary | ICD-10-CM | POA: Diagnosis not present

## 2015-09-19 DIAGNOSIS — M8580 Other specified disorders of bone density and structure, unspecified site: Secondary | ICD-10-CM | POA: Diagnosis not present

## 2015-09-19 DIAGNOSIS — Z1211 Encounter for screening for malignant neoplasm of colon: Secondary | ICD-10-CM | POA: Diagnosis not present

## 2015-09-19 DIAGNOSIS — Z1151 Encounter for screening for human papillomavirus (HPV): Secondary | ICD-10-CM | POA: Insufficient documentation

## 2015-09-19 LAB — HEMOCCULT GUIAC POC 1CARD (OFFICE): Fecal Occult Blood, POC: NEGATIVE

## 2015-09-19 NOTE — Patient Instructions (Signed)
Mammogram yearly Pap and physical  In 3 years Labs at PCP Dexa in 2019 and 2020

## 2015-09-19 NOTE — Progress Notes (Signed)
Patient ID: Rose Palmer, female   DOB: 1950/08/28, 65 y.o.   MRN: [id number] History of Present Illness: Rose Palmer is a 65 year old white female, married, in for a well woman gyn exam and pap. PCP is Z Hall.   Current Medications, Allergies, Past Medical History, Past Surgical History, Family History and Social History were reviewed in [employer] record.     Review of Systems: Patient denies any headaches, hearing loss, fatigue, blurred vision, shortness of breath, chest pain, abdominal pain, problems with bowel movements, urination, or intercourse(not often). No joint pain or mood swings.    Physical Exam:BP 120/80 (BP Location: Left Arm, Patient Position: Sitting, Cuff Size: Normal)   Pulse 74   Ht 5\' 5"  (1.651 m)   Wt 123 lb 8 oz (56 kg)   BMI 20.55 kg/m  General:  Well developed, well nourished, no acute distress Skin:  Warm and dry Neck:  Midline trachea, normal thyroid, good ROM, no lymphadenopathy,no carotid bruits heard Lungs; Clear to auscultation bilaterally Breast:  No dominant palpable mass, retraction, or nipple discharge Cardiovascular: Regular rate and rhythm Abdomen:  Soft, non tender, no hepatosplenomegaly Pelvic:  External genitalia is normal in appearance, no lesions.  The vagina has loss of color,moisture and rugae. Urethra has no lesions or masses. The cervix is smooth and strophic, pap with HPV performed.  Uterus is felt to be normal size, shape, and contour.  No adnexal masses or tenderness noted.Bladder is non tender, no masses felt. Rectal: Good sphincter tone, no polyps, or hemorrhoids felt.  Hemoccult negative. Extremities/musculoskeletal:  No swelling or varicosities noted, no clubbing or cyanosis Psych:  No mood changes, alert and cooperative,seems happy Discussed could stop paps at 68 if desired.  Impression: 1. Encounter for gynecological examination with Papanicolaou smear of cervix   2. Routine cervical smear   3. Osteopenia        Plan: Pap and physical in 1 year Mammogram yearly Labs with PCP Colonoscopy advised Dexa in 2019 or 2020

## 2015-09-20 LAB — CYTOLOGY - PAP

## 2015-10-23 DIAGNOSIS — Z23 Encounter for immunization: Secondary | ICD-10-CM | POA: Diagnosis not present

## 2015-11-02 ENCOUNTER — Encounter: Payer: Self-pay | Admitting: Adult Health

## 2016-02-07 ENCOUNTER — Other Ambulatory Visit (INDEPENDENT_AMBULATORY_CARE_PROVIDER_SITE_OTHER): Payer: Self-pay | Admitting: *Deleted

## 2016-02-07 DIAGNOSIS — Z1211 Encounter for screening for malignant neoplasm of colon: Secondary | ICD-10-CM | POA: Insufficient documentation

## 2016-04-10 DIAGNOSIS — R070 Pain in throat: Secondary | ICD-10-CM | POA: Diagnosis not present

## 2016-04-10 DIAGNOSIS — Z6820 Body mass index (BMI) 20.0-20.9, adult: Secondary | ICD-10-CM | POA: Diagnosis not present

## 2016-04-11 ENCOUNTER — Encounter (INDEPENDENT_AMBULATORY_CARE_PROVIDER_SITE_OTHER): Payer: Self-pay | Admitting: *Deleted

## 2016-04-11 ENCOUNTER — Telehealth (INDEPENDENT_AMBULATORY_CARE_PROVIDER_SITE_OTHER): Payer: Self-pay | Admitting: *Deleted

## 2016-04-11 MED ORDER — PEG-KCL-NACL-NASULF-NA ASC-C 100 G PO SOLR: 1.0000 | Freq: Once | ORAL | 0 refills | Status: AC

## 2016-04-11 NOTE — Telephone Encounter (Signed)
Referring MD/PCP: hall   Procedure: tcs  Reason/Indication:  screening  Has patient had this procedure before?  no  If so, when, by whom and where?    Is there a family history of colon cancer?  no  Who?  What age when diagnosed?    Is patient diabetic?   no      Does patient have prosthetic heart valve or mechanical valve?  no  Do you have a pacemaker?  no  Has patient ever had endocarditis? no  Has patient had joint replacement within last 12 months?  no  Does patient tend to be constipated or take laxatives? no  Does patient have a history of alcohol/drug use?  no  Is patient on Coumadin, Plavix and/or Aspirin? no  Medications: valsartan 160 mg daily, multi vit, calcium  Allergies: nkda  Medication Adjustment per Dr Laural Golden:   Procedure date & time: 05/09/16 at 730

## 2016-04-11 NOTE — Telephone Encounter (Signed)
agree

## 2016-04-11 NOTE — Telephone Encounter (Addendum)
Patient needs movi

## 2016-04-12 ENCOUNTER — Encounter (INDEPENDENT_AMBULATORY_CARE_PROVIDER_SITE_OTHER): Payer: Self-pay | Admitting: *Deleted

## 2016-04-15 DIAGNOSIS — R070 Pain in throat: Secondary | ICD-10-CM | POA: Diagnosis not present

## 2016-05-09 ENCOUNTER — Encounter (HOSPITAL_COMMUNITY)
Admission: RE | Disposition: A | Discharge status: Home Health | Payer: Self-pay | Source: Ambulatory Visit | Attending: Internal Medicine

## 2016-05-09 ENCOUNTER — Encounter (HOSPITAL_COMMUNITY): Payer: Self-pay | Admitting: *Deleted

## 2016-05-09 ENCOUNTER — Ambulatory Visit (HOSPITAL_COMMUNITY)
Admission: RE | Admit: 2016-05-09 | Discharge: 2016-05-09 | Disposition: A | Discharge status: Home Health | Payer: Medicare Other | Source: Ambulatory Visit | Attending: Internal Medicine | Admitting: Internal Medicine

## 2016-05-09 DIAGNOSIS — K5730 Diverticulosis of large intestine without perforation or abscess without bleeding: Secondary | ICD-10-CM | POA: Diagnosis not present

## 2016-05-09 DIAGNOSIS — K648 Other hemorrhoids: Secondary | ICD-10-CM | POA: Diagnosis not present

## 2016-05-09 DIAGNOSIS — Z79899 Other long term (current) drug therapy: Secondary | ICD-10-CM | POA: Diagnosis not present

## 2016-05-09 DIAGNOSIS — K644 Residual hemorrhoidal skin tags: Secondary | ICD-10-CM | POA: Insufficient documentation

## 2016-05-09 DIAGNOSIS — I10 Essential (primary) hypertension: Secondary | ICD-10-CM | POA: Insufficient documentation

## 2016-05-09 DIAGNOSIS — M8580 Other specified disorders of bone density and structure, unspecified site: Secondary | ICD-10-CM | POA: Insufficient documentation

## 2016-05-09 DIAGNOSIS — Z1211 Encounter for screening for malignant neoplasm of colon: Secondary | ICD-10-CM | POA: Diagnosis not present

## 2016-05-09 HISTORY — PX: COLONOSCOPY: SHX5424

## 2016-05-09 SURGERY — COLONOSCOPY
Anesthesia: Moderate Sedation

## 2016-05-09 MED ORDER — MIDAZOLAM HCL 5 MG/5ML IJ SOLN
INTRAMUSCULAR | Status: AC
  Filled 2016-05-09: qty 10

## 2016-05-09 MED ORDER — MIDAZOLAM HCL 5 MG/5ML IJ SOLN
INTRAMUSCULAR | Status: DC | PRN
  Administered 2016-05-09: 1 mg via INTRAVENOUS
  Administered 2016-05-09: 2 mg via INTRAVENOUS
  Administered 2016-05-09: 1 mg via INTRAVENOUS
  Administered 2016-05-09: 2 mg via INTRAVENOUS

## 2016-05-09 MED ORDER — SODIUM CHLORIDE 0.9 % IV SOLN
INTRAVENOUS | Status: DC
  Administered 2016-05-09: 07:00:00 via INTRAVENOUS

## 2016-05-09 MED ORDER — MEPERIDINE HCL 50 MG/ML IJ SOLN
INTRAMUSCULAR | Status: DC | PRN
Start: 2016-05-09 — End: 2016-05-09
  Administered 2016-05-09 (×2): 25 mg

## 2016-05-09 MED ORDER — MEPERIDINE HCL 50 MG/ML IJ SOLN
INTRAMUSCULAR | Status: AC
  Filled 2016-05-09: qty 1

## 2016-05-09 MED ORDER — SIMETHICONE 40 MG/0.6ML PO SUSP
ORAL | Status: DC | PRN
  Administered 2016-05-09: 08:00:00

## 2016-05-09 NOTE — H&P (Signed)
Rose Palmer is an 66 y.o. female.   Chief Complaint: Patient is here for colonoscopy. HPI: This 66 year old Caucasian female who is here for screening colonoscopy. This is patient's first exam. Patient denies abdominal pain change in bowel habits or rectal bleeding. Family history is negative for CRC.  Past Medical History:  Diagnosis Date  . Hypertension   . Osteopenia 05/24/2015   Repeat DEXA in 2 years  . Seasonal allergies    Zyrtec as needed    Past Surgical History:  Procedure Laterality Date  . BUNIONECTOMY  03/28/2011   Procedure: Lillard Anes;  Surgeon: Marcheta Grammes, DPM;  Location: [facility];  Service: Orthopedics;  Laterality: Left;  [place] Bunionectomy  . HAMMER TOE SURGERY  03/28/2011   Procedure: HAMMER TOE CORRECTION;  Surgeon: Marcheta Grammes, DPM;  Location: [facility];  Service: Orthopedics;  Laterality: Left;  Hammer Toe Repair 2nd Toe Left Foot  . WEIL OSTEOTOMY  03/28/2011   Procedure: WEIL OSTEOTOMY;  Surgeon: Marcheta Grammes, DPM;  Location: [facility];  Service: Orthopedics;  Laterality: Left;  Weil Osteotomy 2nd Metatarsal Left Foot    Family History  Problem Relation Age of Onset  . Hypertension Mother   . Heart attack Mother   . Hypertension Father   . Congestive Heart Failure Father   . Diabetes Father   . Hypertension Sister   . Cancer Daughter     skin  . Cancer Maternal Grandmother   . Diabetes Paternal Grandmother   . Hypertension Sister   . Hypertension Sister   . Hypotension Neg Hx   . Anesthesia problems Neg Hx   . Malignant hyperthermia Neg Hx   . Pseudochol deficiency Neg Hx    Social History:  reports that she has never smoked. She has never used smokeless tobacco. She reports that she drinks alcohol. She reports that she does not use drugs.  Allergies:  Allergies  Allergen Reactions  . Bactrim [Sulfamethoxazole-Trimethoprim] Other (See Comments)    Caused dizziness    Medications Prior to Admission  Medication Sig  Dispense Refill  . Calcium Carb-Cholecalciferol (CALCIUM 600+D) 600-800 MG-UNIT TABS Take 1 tablet by mouth 2 (two) times daily.    . cetirizine (ZYRTEC) 10 MG tablet Take 10 mg by mouth daily as needed. allergies    . ibuprofen (ADVIL,MOTRIN) 200 MG tablet Take 400 mg by mouth 2 (two) times daily as needed for headache or mild pain.     . Multiple Vitamin (MULITIVITAMIN WITH MINERALS) TABS Take 1 tablet by mouth daily.    . valsartan (DIOVAN) 160 MG tablet Take 160 mg by mouth every evening.     . risedronate (ACTONEL) 150 MG tablet TAKE 1 TABLET BY MOUTH EVERY MONTH (Patient not taking: Reported on 05/07/2016) 1 tablet 11    No results found for this or any previous visit (from the past 48 hour(s)). No results found.  ROS  Blood pressure 125/77, pulse 81, temperature 97.8 F (36.6 C), temperature source Oral, resp. rate 18, height 5' 5.5" (1.664 m), weight 123 lb (55.8 kg), SpO2 99 %. Physical Exam  Constitutional: She appears well-developed and well-nourished.  HENT:  Mouth/Throat: Oropharynx is clear and moist.  Eyes: Conjunctivae are normal. No scleral icterus.  Neck: No thyromegaly present.  Cardiovascular: Normal rate, regular rhythm and normal heart sounds.   No murmur heard. Respiratory: Effort normal and breath sounds normal.  GI: Soft. She exhibits no distension and no mass. There is no tenderness.  Musculoskeletal: She exhibits no  edema.  Lymphadenopathy:    She has no cervical adenopathy.  Neurological: She is alert.  Skin: Skin is warm and dry.     Assessment/Plan Average risk screening colonoscopy.  Hildred Laser, MD 05/09/2016, 7:28 AM

## 2016-05-09 NOTE — Op Note (Signed)
[facility] Patient Name: Rose Palmer Procedure Date: 05/09/2016 7:03 AM MRN: [id number] Date of Birth: 1950-07-26 Attending MD: Hildred Laser , MD CSN: [id number] Age: 65 Admit Type: Outpatient Procedure:                Colonoscopy Indications:              Screening for colorectal malignant neoplasm Providers:                Hildred Laser, MD, Otis Peak B. Sharon Seller, RN, Janeece Riggers, RN, Aram Candela Referring MD:             Delphina Cahill, MD Medicines:                Meperidine 50 mg IV, Midazolam 6 mg IV Complications:            No immediate complications. Estimated Blood Loss:     Estimated blood loss: none. Procedure:                Pre-Anesthesia Assessment:                           - Prior to the procedure, a History and Physical                            was performed, and patient medications and                            allergies were reviewed. The patient's tolerance of                            previous anesthesia was also reviewed. The risks                            and benefits of the procedure and the sedation                            options and risks were discussed with the patient.                            All questions were answered, and informed consent                            was obtained. Prior Anticoagulants: The patient has                            taken no previous anticoagulant or antiplatelet                            agents. ASA Grade Assessment: II - A patient with                            mild systemic disease. After reviewing the risks  and benefits, the patient was deemed in                            satisfactory condition to undergo the procedure.                           After obtaining informed consent, the colonoscope                            was passed under direct vision. Throughout the                            procedure, the patient's blood pressure, pulse, and                  oxygen saturations were monitored continuously. The                            [id number] ([id number]) was introduced through the                            anus and advanced to the the cecum, identified by                            appendiceal orifice and ileocecal valve. The                            colonoscopy was performed without difficulty. The                            patient tolerated the procedure well. The quality                            of the bowel preparation was adequate. The                            ileocecal valve, appendiceal orifice, and rectum                            were photographed. Scope In: 7:42:43 AM Scope Out: 8:01:17 AM Scope Withdrawal Time: 0 hours 7 minutes 15 seconds  Total Procedure Duration: 0 hours 18 minutes 34 seconds  Findings:      The perianal and digital rectal examinations were normal.      A few small-mouthed diverticula were found in the sigmoid colon.      The exam was otherwise normal throughout the examined colon.      External and internal hemorrhoids were found during retroflexion. The       hemorrhoids were small. Impression:               - Diverticulosis in the sigmoid colon.                           - External and internal hemorrhoids.                           -  No specimens collected. Moderate Sedation:      Moderate (conscious) sedation was administered by the endoscopy nurse       and supervised by the endoscopist. The following parameters were       monitored: oxygen saturation, heart rate, blood pressure, CO2       capnography and response to care. Total physician intraservice time was       24 minutes. Recommendation:           - Patient has a contact number available for                            emergencies. The signs and symptoms of potential                            delayed complications were discussed with the                            patient. Return to normal activities tomorrow.                             Written discharge instructions were provided to the                            patient.                           - High fiber diet today.                           - Continue present medications.                           - Repeat colonoscopy in 10 years for screening                            purposes. Procedure Code(s):        --- Professional ---                           [phone], Colonoscopy, flexible; diagnostic, including                            collection of specimen(s) by brushing or washing,                            when performed (separate procedure)                           99152, Moderate sedation services provided by the                            same physician or other qualified health care                            professional performing the diagnostic or  therapeutic service that the sedation supports,                            requiring the presence of an independent trained                            observer to assist in the monitoring of the                            patient's level of consciousness and physiological                            status; initial 15 minutes of intraservice time,                            patient age 59 years or older                           [phone], Moderate sedation services; each additional                            15 minutes intraservice time Diagnosis Code(s):        --- Professional ---                           Z12.11, Encounter for screening for malignant                            neoplasm of colon                           K64.8, Other hemorrhoids                           K57.30, Diverticulosis of large intestine without                            perforation or abscess without bleeding CPT copyright 2016 American Medical Association. All rights reserved. The codes documented in this report are preliminary and upon coder review may  be revised to meet current compliance  requirements. Hildred Laser, MD Hildred Laser, MD 05/09/2016 8:11:41 AM This report has been signed electronically. Number of Addenda: 0

## 2016-05-09 NOTE — Discharge Instructions (Addendum)
Resume usual medications and high fiber diet. No driving for 24 hours. Next screening exam in 10 years.   Colonoscopy, Adult, Care After This sheet gives you information about how to care for yourself after your procedure. Your health care provider may also give you more specific instructions. If you have problems or questions, contact your health care provider. What can I expect after the procedure? After the procedure, it is common to have:  A small amount of blood in your stool for 24 hours after the procedure.  Some gas.  Mild abdominal cramping or bloating. Follow these instructions at home: General instructions    For the first 24 hours after the procedure:  Do not drive or use machinery.  Do not sign important documents.  Do not drink alcohol.  Do your regular daily activities at a slower pace than normal.  Eat soft, easy-to-digest foods.  Rest often.  Take over-the-counter or prescription medicines only as told by your health care provider.  It is up to you to get the results of your procedure. Ask your health care provider, or the department performing the procedure, when your results will be ready. Relieving cramping and bloating   Try walking around when you have cramps or feel bloated.  Apply heat to your abdomen as told by your health care provider. Use a heat source that your health care provider recommends, such as a moist heat pack or a heating pad.  Place a towel between your skin and the heat source.  Leave the heat on for 20-30 minutes.  Remove the heat if your skin turns bright red. This is especially important if you are unable to feel pain, heat, or cold. You may have a greater risk of getting burned. Eating and drinking   Drink enough fluid to keep your urine clear or pale yellow.  Resume your normal diet as instructed by your health care provider. Avoid heavy or fried foods that are hard to digest.  Avoid drinking alcohol for as long as  instructed by your health care provider. Contact a health care provider if:  You have blood in your stool 2-3 days after the procedure. Get help right away if:  You have more than a small spotting of blood in your stool.  You pass large blood clots in your stool.  Your abdomen is swollen.  You have nausea or vomiting.  You have a fever.  You have increasing abdominal pain that is not relieved with medicine. This information is not intended to replace advice given to you by your health care provider. Make sure you discuss any questions you have with your health care provider.   High-Fiber Diet Fiber, also called dietary fiber, is a type of carbohydrate found in fruits, vegetables, whole grains, and beans. A high-fiber diet can have many health benefits. Your health care provider may recommend a high-fiber diet to help:  Prevent constipation. Fiber can make your bowel movements more regular.  Lower your cholesterol.  Relieve hemorrhoids, uncomplicated diverticulosis, or irritable bowel syndrome.  Prevent overeating as part of a weight-loss plan.  Prevent heart disease, type 2 diabetes, and certain cancers. What is my plan? The recommended daily intake of fiber includes:  38 grams for men under age 39.  68 grams for men over age 79.  74 grams for women under age 26.  72 grams for women over age 30. You can get the recommended daily intake of dietary fiber by eating a variety of fruits, vegetables, grains,  and beans. Your health care provider may also recommend a fiber supplement if it is not possible to get enough fiber through your diet. What do I need to know about a high-fiber diet?  Fiber supplements have not been widely studied for their effectiveness, so it is better to get fiber through food sources.  Always check the fiber content on thenutrition facts label of any prepackaged food. Look for foods that contain at least 5 grams of fiber per serving.  Ask your  dietitian if you have questions about specific foods that are related to your condition, especially if those foods are not listed in the following section.  Increase your daily fiber consumption gradually. Increasing your intake of dietary fiber too quickly may cause bloating, cramping, or gas.  Drink plenty of water. Water helps you to digest fiber. What foods can I eat? Grains  Whole-grain breads. Multigrain cereal. Oats and oatmeal. Brown rice. Barley. Bulgur wheat. [place]yer. Bran muffins. Popcorn. Rye wafer crackers. Vegetables  Sweet potatoes. Spinach. Kale. Artichokes. Cabbage. Broccoli. Green peas. Carrots. Squash. Fruits  Berries. Pears. Apples. Oranges. Avocados. Prunes and raisins. Dried figs. Meats and Other Protein Sources  Navy, kidney, pinto, and soy beans. Split peas. Lentils. Nuts and seeds. Dairy  Fiber-fortified yogurt. Beverages  Fiber-fortified soy milk. Fiber-fortified orange juice. Other  Fiber bars. The items listed above may not be a complete list of recommended foods or beverages. Contact your dietitian for more options.  What foods are not recommended? Grains  White bread. Pasta made with refined flour. White rice. Vegetables  Fried potatoes. Canned vegetables. Well-cooked vegetables. Fruits  Fruit juice. Cooked, strained fruit. Meats and Other Protein Sources  Fatty cuts of meat. Fried Sales executive or fried fish. Dairy  Milk. Yogurt. Cream cheese. Sour cream. Beverages  Soft drinks. Other  Cakes and pastries. Butter and oils. The items listed above may not be a complete list of foods and beverages to avoid. Contact your dietitian for more information.  What are some tips for including high-fiber foods in my diet?  Eat a wide variety of high-fiber foods.  Make sure that half of all grains consumed each day are whole grains.  Replace breads and cereals made from refined flour or white flour with whole-grain breads and cereals.  Replace white rice with  brown rice, bulgur wheat, or millet.  Start the day with a breakfast that is high in fiber, such as a cereal that contains at least 5 grams of fiber per serving.  Use beans in place of meat in soups, salads, or pasta.  Eat high-fiber snacks, such as berries, raw vegetables, nuts, or popcorn. This information is not intended to replace advice given to you by your health care provider. Make sure you discuss any questions you have with your health care provider.   Diverticulosis Diverticulosis is a condition that develops when small pouches (diverticula) form in the wall of the large intestine (colon). The colon is where water is absorbed and stool is formed. The pouches form when the inside layer of the colon pushes through weak spots in the outer layers of the colon. You may have a few pouches or many of them. What are the causes? The cause of this condition is not known. What increases the risk? The following factors may make you more likely to develop this condition:  Being older than age 7. Your risk for this condition increases with age. Diverticulosis is rare among people younger than age 5. By age 24, many people  have it.  Eating a low-fiber diet.  Having frequent constipation.  Being overweight.  Not getting enough exercise.  Smoking.  Taking over-the-counter pain medicines, like aspirin and ibuprofen.  Having a family history of diverticulosis. What are the signs or symptoms? In most people, there are no symptoms of this condition. If you do have symptoms, they may include:  Bloating.  Cramps in the abdomen.  Constipation or diarrhea.  Pain in the lower left side of the abdomen. How is this diagnosed? This condition is most often diagnosed during an exam for other colon problems. Because diverticulosis usually has no symptoms, it often cannot be diagnosed independently. This condition may be diagnosed by:  Using a flexible scope to examine the colon  (colonoscopy).  Taking an X-ray of the colon after dye has been put into the colon (barium enema).  Doing a CT scan. How is this treated? You may not need treatment for this condition if you have never developed an infection related to diverticulosis. If you have had an infection before, treatment may include:  Eating a high-fiber diet. This may include eating more fruits, vegetables, and grains.  Taking a fiber supplement.  Taking a live bacteria supplement (probiotic).  Taking medicine to relax your colon.  Taking antibiotic medicines. Follow these instructions at home:  Drink 6-8 glasses of water or more each day to prevent constipation.  Try not to strain when you have a bowel movement.  If you have had an infection before:  Eat more fiber as directed by your health care provider or your diet and nutrition specialist (dietitian).  Take a fiber supplement or probiotic, if your health care provider approves.  Take over-the-counter and prescription medicines only as told by your health care provider.  If you were prescribed an antibiotic, take it as told by your health care provider. Do not stop taking the antibiotic even if you start to feel better.  Keep all follow-up visits as told by your health care provider. This is important. Contact a health care provider if:  You have pain in your abdomen.  You have bloating.  You have cramps.  You have not had a bowel movement in 3 days. Get help right away if:  Your pain gets worse.  Your bloating becomes very bad.  You have a fever or chills, and your symptoms suddenly get worse.  You vomit.  You have bowel movements that are bloody or black.  You have bleeding from your rectum. Summary  Diverticulosis is a condition that develops when small pouches (diverticula) form in the wall of the large intestine (colon).  You may have a few pouches or many of them.  This condition is most often diagnosed during an exam  for other colon problems.  If you have had an infection related to diverticulosis, treatment may include increasing the fiber in your diet, taking supplements, or taking medicines. This information is not intended to replace advice given to you by your health care provider. Make sure you discuss any questions you have with your health care provider. Document Released: 09/21/2003 Document Revised: 11/13/2015 Document Reviewed: 11/13/2015 Elsevier Interactive Patient Education  2017 [employer].

## 2016-05-10 ENCOUNTER — Encounter (HOSPITAL_COMMUNITY): Payer: Self-pay | Admitting: Internal Medicine

## 2016-06-25 DIAGNOSIS — Z1231 Encounter for screening mammogram for malignant neoplasm of breast: Secondary | ICD-10-CM | POA: Diagnosis not present

## 2016-08-30 DIAGNOSIS — Z23 Encounter for immunization: Secondary | ICD-10-CM | POA: Diagnosis not present

## 2016-09-06 DIAGNOSIS — I10 Essential (primary) hypertension: Secondary | ICD-10-CM | POA: Diagnosis not present

## 2016-09-06 DIAGNOSIS — Z1159 Encounter for screening for other viral diseases: Secondary | ICD-10-CM | POA: Diagnosis not present

## 2016-09-16 DIAGNOSIS — R7301 Impaired fasting glucose: Secondary | ICD-10-CM | POA: Diagnosis not present

## 2016-09-16 DIAGNOSIS — G4700 Insomnia, unspecified: Secondary | ICD-10-CM | POA: Diagnosis not present

## 2016-09-16 DIAGNOSIS — M8580 Other specified disorders of bone density and structure, unspecified site: Secondary | ICD-10-CM | POA: Diagnosis not present

## 2016-09-16 DIAGNOSIS — Z681 Body mass index (BMI) 19 or less, adult: Secondary | ICD-10-CM | POA: Diagnosis not present

## 2016-09-25 DIAGNOSIS — H2513 Age-related nuclear cataract, bilateral: Secondary | ICD-10-CM | POA: Diagnosis not present

## 2016-10-02 DIAGNOSIS — Z23 Encounter for immunization: Secondary | ICD-10-CM | POA: Diagnosis not present

## 2017-09-05 ENCOUNTER — Other Ambulatory Visit: Payer: Self-pay | Admitting: Internal Medicine

## 2017-09-05 DIAGNOSIS — Z780 Asymptomatic menopausal state: Secondary | ICD-10-CM

## 2017-09-17 ENCOUNTER — Encounter: Payer: Self-pay | Admitting: Adult Health

## 2017-09-17 DIAGNOSIS — Z1231 Encounter for screening mammogram for malignant neoplasm of breast: Secondary | ICD-10-CM | POA: Diagnosis not present

## 2017-09-17 DIAGNOSIS — M8589 Other specified disorders of bone density and structure, multiple sites: Secondary | ICD-10-CM | POA: Diagnosis not present

## 2017-09-29 DIAGNOSIS — Z23 Encounter for immunization: Secondary | ICD-10-CM | POA: Diagnosis not present

## 2017-09-30 ENCOUNTER — Telehealth: Payer: Self-pay | Admitting: Adult Health

## 2017-09-30 DIAGNOSIS — R7301 Impaired fasting glucose: Secondary | ICD-10-CM | POA: Diagnosis not present

## 2017-09-30 DIAGNOSIS — I10 Essential (primary) hypertension: Secondary | ICD-10-CM | POA: Diagnosis not present

## 2017-09-30 DIAGNOSIS — R030 Elevated blood-pressure reading, without diagnosis of hypertension: Secondary | ICD-10-CM | POA: Diagnosis not present

## 2017-09-30 NOTE — Telephone Encounter (Signed)
Pt aware that dexa showed osteopenia and mammogram was normal

## 2017-10-15 DIAGNOSIS — Z0000 Encounter for general adult medical examination without abnormal findings: Secondary | ICD-10-CM | POA: Diagnosis not present

## 2017-10-15 DIAGNOSIS — R7301 Impaired fasting glucose: Secondary | ICD-10-CM | POA: Diagnosis not present

## 2017-10-15 DIAGNOSIS — M8580 Other specified disorders of bone density and structure, unspecified site: Secondary | ICD-10-CM | POA: Diagnosis not present

## 2017-10-15 DIAGNOSIS — F5101 Primary insomnia: Secondary | ICD-10-CM | POA: Diagnosis not present

## 2017-10-15 DIAGNOSIS — Z6820 Body mass index (BMI) 20.0-20.9, adult: Secondary | ICD-10-CM | POA: Diagnosis not present

## 2017-11-14 DIAGNOSIS — H2513 Age-related nuclear cataract, bilateral: Secondary | ICD-10-CM | POA: Diagnosis not present

## 2017-11-25 ENCOUNTER — Encounter: Payer: Self-pay | Admitting: Adult Health

## 2017-11-25 ENCOUNTER — Ambulatory Visit (INDEPENDENT_AMBULATORY_CARE_PROVIDER_SITE_OTHER): Payer: Medicare Other | Admitting: Adult Health

## 2017-11-25 VITALS — BP 127/75 | HR 84 | Ht 65.0 in | Wt 123.0 lb

## 2017-11-25 DIAGNOSIS — Z1211 Encounter for screening for malignant neoplasm of colon: Secondary | ICD-10-CM | POA: Diagnosis not present

## 2017-11-25 DIAGNOSIS — Z1212 Encounter for screening for malignant neoplasm of rectum: Secondary | ICD-10-CM | POA: Diagnosis not present

## 2017-11-25 DIAGNOSIS — Z01419 Encounter for gynecological examination (general) (routine) without abnormal findings: Secondary | ICD-10-CM

## 2017-11-25 LAB — HEMOCCULT GUIAC POC 1CARD (OFFICE): Fecal Occult Blood, POC: NEGATIVE

## 2017-11-25 NOTE — Progress Notes (Signed)
Patient ID: Rose Palmer, female   DOB: 1950/03/16, 67 y.o.   MRN: [id number] History of Present Illness: Rose Palmer is a 67 year old white female, married, PM in for a well woman gyn exam, she had a normal pap with negative HPV 09/19/15. PCP is Dr Nevada Crane.   Current Medications, Allergies, Past Medical History, Past Surgical History, Family History and Social History were reviewed in [employer] record.     Review of Systems: Patient denies any headaches, hearing loss, fatigue, blurred vision, shortness of breath, chest pain, abdominal pain, problems with bowel movements, urination, or intercourse.(not active often) No joint pain or mood swings. +occasional external itching    Physical Exam:BP 127/75 (BP Location: Left Arm, Patient Position: Sitting, Cuff Size: Normal)   Pulse 84   Ht 5\' 5"  (1.651 m)   Wt 123 lb (55.8 kg)   BMI 20.47 kg/m  General:  Well developed, well nourished, no acute distress Skin:  Warm and dry Neck:  Midline trachea, normal thyroid, good ROM, no lymphadenopathy,no carotid bruits heard Lungs; Clear to auscultation bilaterally Breast:  No dominant palpable mass, retraction, or nipple discharge Cardiovascular: Regular rate and rhythm Abdomen:  Soft, non tender, no hepatosplenomegaly Pelvic:  External genitalia is normal in appearance, no lesions.  The vagina is pale with loss of moisture and rugae. Urethra has no lesions or masses. The cervix is bulbous.  Uterus is felt to be normal size, shape, and contour.  No adnexal masses or tenderness noted.Bladder is non tender, no masses felt. Rectal: Good sphincter tone, no polyps, or hemorrhoids felt.  Hemoccult negative. Extremities/musculoskeletal:  No swelling or varicosities noted, no clubbing or cyanosis Psych:  No mood changes, alert and cooperative,seems happy PHQ 2 score 0. Examination chaperoned by Estill Bamberg Rash LPN.  Impression: 1. Encounter for well woman exam with routine gynecological exam   2.  Screening for colorectal cancer       Plan:  Pap and physical in 2 years Mammogram yearly Labs with PCP

## 2018-03-10 DIAGNOSIS — I10 Essential (primary) hypertension: Secondary | ICD-10-CM | POA: Diagnosis not present

## 2018-03-10 DIAGNOSIS — R7301 Impaired fasting glucose: Secondary | ICD-10-CM | POA: Diagnosis not present

## 2018-03-10 DIAGNOSIS — M8580 Other specified disorders of bone density and structure, unspecified site: Secondary | ICD-10-CM | POA: Diagnosis not present

## 2018-03-10 DIAGNOSIS — Z6820 Body mass index (BMI) 20.0-20.9, adult: Secondary | ICD-10-CM | POA: Diagnosis not present

## 2018-03-17 DIAGNOSIS — M8580 Other specified disorders of bone density and structure, unspecified site: Secondary | ICD-10-CM | POA: Diagnosis not present

## 2018-03-17 DIAGNOSIS — G4700 Insomnia, unspecified: Secondary | ICD-10-CM | POA: Diagnosis not present

## 2018-03-17 DIAGNOSIS — N182 Chronic kidney disease, stage 2 (mild): Secondary | ICD-10-CM | POA: Diagnosis not present

## 2018-03-17 DIAGNOSIS — I129 Hypertensive chronic kidney disease with stage 1 through stage 4 chronic kidney disease, or unspecified chronic kidney disease: Secondary | ICD-10-CM | POA: Diagnosis not present

## 2018-07-09 ENCOUNTER — Other Ambulatory Visit: Payer: Self-pay

## 2018-07-09 ENCOUNTER — Other Ambulatory Visit: Payer: Medicare Other

## 2018-07-09 DIAGNOSIS — Z20822 Contact with and (suspected) exposure to covid-19: Secondary | ICD-10-CM

## 2018-07-09 DIAGNOSIS — R6889 Other general symptoms and signs: Secondary | ICD-10-CM | POA: Diagnosis not present

## 2018-07-15 LAB — NOVEL CORONAVIRUS, NAA: SARS-CoV-2, NAA: NOT DETECTED

## 2018-08-10 ENCOUNTER — Other Ambulatory Visit: Payer: Self-pay

## 2018-09-30 DIAGNOSIS — Z1231 Encounter for screening mammogram for malignant neoplasm of breast: Secondary | ICD-10-CM | POA: Diagnosis not present

## 2018-09-30 DIAGNOSIS — Z23 Encounter for immunization: Secondary | ICD-10-CM | POA: Diagnosis not present

## 2018-10-27 DIAGNOSIS — M25512 Pain in left shoulder: Secondary | ICD-10-CM | POA: Diagnosis not present

## 2018-10-27 DIAGNOSIS — M8580 Other specified disorders of bone density and structure, unspecified site: Secondary | ICD-10-CM | POA: Diagnosis not present

## 2018-10-27 DIAGNOSIS — N182 Chronic kidney disease, stage 2 (mild): Secondary | ICD-10-CM | POA: Diagnosis not present

## 2018-10-27 DIAGNOSIS — Z0000 Encounter for general adult medical examination without abnormal findings: Secondary | ICD-10-CM | POA: Diagnosis not present

## 2018-10-27 DIAGNOSIS — I129 Hypertensive chronic kidney disease with stage 1 through stage 4 chronic kidney disease, or unspecified chronic kidney disease: Secondary | ICD-10-CM | POA: Diagnosis not present

## 2018-10-27 DIAGNOSIS — G4700 Insomnia, unspecified: Secondary | ICD-10-CM | POA: Diagnosis not present

## 2018-11-16 DIAGNOSIS — H2513 Age-related nuclear cataract, bilateral: Secondary | ICD-10-CM | POA: Diagnosis not present

## 2018-12-02 ENCOUNTER — Other Ambulatory Visit: Payer: Self-pay

## 2019-02-09 DIAGNOSIS — Z23 Encounter for immunization: Secondary | ICD-10-CM | POA: Diagnosis not present

## 2019-03-09 DIAGNOSIS — Z23 Encounter for immunization: Secondary | ICD-10-CM | POA: Diagnosis not present

## 2019-10-04 DIAGNOSIS — Z23 Encounter for immunization: Secondary | ICD-10-CM | POA: Diagnosis not present

## 2019-10-06 DIAGNOSIS — Z1231 Encounter for screening mammogram for malignant neoplasm of breast: Secondary | ICD-10-CM | POA: Diagnosis not present

## 2019-11-02 DIAGNOSIS — I129 Hypertensive chronic kidney disease with stage 1 through stage 4 chronic kidney disease, or unspecified chronic kidney disease: Secondary | ICD-10-CM | POA: Diagnosis not present

## 2019-11-02 DIAGNOSIS — M8580 Other specified disorders of bone density and structure, unspecified site: Secondary | ICD-10-CM | POA: Diagnosis not present

## 2019-11-02 DIAGNOSIS — R7301 Impaired fasting glucose: Secondary | ICD-10-CM | POA: Diagnosis not present

## 2019-11-02 DIAGNOSIS — F5101 Primary insomnia: Secondary | ICD-10-CM | POA: Diagnosis not present

## 2019-11-02 DIAGNOSIS — N182 Chronic kidney disease, stage 2 (mild): Secondary | ICD-10-CM | POA: Diagnosis not present

## 2019-11-02 DIAGNOSIS — G4700 Insomnia, unspecified: Secondary | ICD-10-CM | POA: Diagnosis not present

## 2019-11-02 DIAGNOSIS — Z6820 Body mass index (BMI) 20.0-20.9, adult: Secondary | ICD-10-CM | POA: Diagnosis not present

## 2019-11-02 DIAGNOSIS — M25512 Pain in left shoulder: Secondary | ICD-10-CM | POA: Diagnosis not present

## 2019-11-02 DIAGNOSIS — Z0000 Encounter for general adult medical examination without abnormal findings: Secondary | ICD-10-CM | POA: Diagnosis not present

## 2019-11-05 ENCOUNTER — Other Ambulatory Visit (HOSPITAL_COMMUNITY): Payer: Self-pay | Admitting: Internal Medicine

## 2019-11-05 DIAGNOSIS — E782 Mixed hyperlipidemia: Secondary | ICD-10-CM | POA: Diagnosis not present

## 2019-11-05 DIAGNOSIS — Z13820 Encounter for screening for osteoporosis: Secondary | ICD-10-CM

## 2019-11-05 DIAGNOSIS — G4700 Insomnia, unspecified: Secondary | ICD-10-CM | POA: Diagnosis not present

## 2019-11-05 DIAGNOSIS — I129 Hypertensive chronic kidney disease with stage 1 through stage 4 chronic kidney disease, or unspecified chronic kidney disease: Secondary | ICD-10-CM | POA: Diagnosis not present

## 2019-11-05 DIAGNOSIS — M8580 Other specified disorders of bone density and structure, unspecified site: Secondary | ICD-10-CM | POA: Diagnosis not present

## 2019-11-05 DIAGNOSIS — Z0001 Encounter for general adult medical examination with abnormal findings: Secondary | ICD-10-CM | POA: Diagnosis not present

## 2019-11-05 DIAGNOSIS — N182 Chronic kidney disease, stage 2 (mild): Secondary | ICD-10-CM | POA: Diagnosis not present

## 2019-11-09 DIAGNOSIS — Z23 Encounter for immunization: Secondary | ICD-10-CM | POA: Diagnosis not present

## 2019-11-16 DIAGNOSIS — H2513 Age-related nuclear cataract, bilateral: Secondary | ICD-10-CM | POA: Diagnosis not present

## 2020-04-24 DIAGNOSIS — E782 Mixed hyperlipidemia: Secondary | ICD-10-CM | POA: Diagnosis not present

## 2020-04-24 DIAGNOSIS — Z6820 Body mass index (BMI) 20.0-20.9, adult: Secondary | ICD-10-CM | POA: Diagnosis not present

## 2020-04-24 DIAGNOSIS — R7301 Impaired fasting glucose: Secondary | ICD-10-CM | POA: Diagnosis not present

## 2020-04-24 DIAGNOSIS — M8580 Other specified disorders of bone density and structure, unspecified site: Secondary | ICD-10-CM | POA: Diagnosis not present

## 2020-04-24 DIAGNOSIS — M25512 Pain in left shoulder: Secondary | ICD-10-CM | POA: Diagnosis not present

## 2020-04-24 DIAGNOSIS — N182 Chronic kidney disease, stage 2 (mild): Secondary | ICD-10-CM | POA: Diagnosis not present

## 2020-04-24 DIAGNOSIS — I129 Hypertensive chronic kidney disease with stage 1 through stage 4 chronic kidney disease, or unspecified chronic kidney disease: Secondary | ICD-10-CM | POA: Diagnosis not present

## 2020-04-24 DIAGNOSIS — Z0000 Encounter for general adult medical examination without abnormal findings: Secondary | ICD-10-CM | POA: Diagnosis not present

## 2020-04-24 DIAGNOSIS — F5101 Primary insomnia: Secondary | ICD-10-CM | POA: Diagnosis not present

## 2020-04-24 DIAGNOSIS — G4700 Insomnia, unspecified: Secondary | ICD-10-CM | POA: Diagnosis not present

## 2020-04-26 DIAGNOSIS — I129 Hypertensive chronic kidney disease with stage 1 through stage 4 chronic kidney disease, or unspecified chronic kidney disease: Secondary | ICD-10-CM | POA: Diagnosis not present

## 2020-04-26 DIAGNOSIS — N182 Chronic kidney disease, stage 2 (mild): Secondary | ICD-10-CM | POA: Diagnosis not present

## 2020-04-26 DIAGNOSIS — R7301 Impaired fasting glucose: Secondary | ICD-10-CM | POA: Diagnosis not present

## 2020-04-26 DIAGNOSIS — G4700 Insomnia, unspecified: Secondary | ICD-10-CM | POA: Diagnosis not present

## 2020-04-26 DIAGNOSIS — E782 Mixed hyperlipidemia: Secondary | ICD-10-CM | POA: Diagnosis not present

## 2020-04-26 DIAGNOSIS — M8580 Other specified disorders of bone density and structure, unspecified site: Secondary | ICD-10-CM | POA: Diagnosis not present

## 2020-04-27 DIAGNOSIS — Z23 Encounter for immunization: Secondary | ICD-10-CM | POA: Diagnosis not present

## 2020-10-09 DIAGNOSIS — Z1231 Encounter for screening mammogram for malignant neoplasm of breast: Secondary | ICD-10-CM | POA: Diagnosis not present

## 2020-10-16 DIAGNOSIS — Z23 Encounter for immunization: Secondary | ICD-10-CM | POA: Diagnosis not present

## 2020-10-25 DIAGNOSIS — R7301 Impaired fasting glucose: Secondary | ICD-10-CM | POA: Diagnosis not present

## 2020-10-25 DIAGNOSIS — E782 Mixed hyperlipidemia: Secondary | ICD-10-CM | POA: Diagnosis not present

## 2020-11-01 DIAGNOSIS — M8580 Other specified disorders of bone density and structure, unspecified site: Secondary | ICD-10-CM | POA: Diagnosis not present

## 2020-11-01 DIAGNOSIS — R7301 Impaired fasting glucose: Secondary | ICD-10-CM | POA: Diagnosis not present

## 2020-11-01 DIAGNOSIS — E782 Mixed hyperlipidemia: Secondary | ICD-10-CM | POA: Diagnosis not present

## 2020-11-01 DIAGNOSIS — I129 Hypertensive chronic kidney disease with stage 1 through stage 4 chronic kidney disease, or unspecified chronic kidney disease: Secondary | ICD-10-CM | POA: Diagnosis not present

## 2020-11-01 DIAGNOSIS — G4700 Insomnia, unspecified: Secondary | ICD-10-CM | POA: Diagnosis not present

## 2020-11-01 DIAGNOSIS — N182 Chronic kidney disease, stage 2 (mild): Secondary | ICD-10-CM | POA: Diagnosis not present

## 2020-11-20 DIAGNOSIS — H25813 Combined forms of age-related cataract, bilateral: Secondary | ICD-10-CM | POA: Diagnosis not present

## 2020-11-20 DIAGNOSIS — H52203 Unspecified astigmatism, bilateral: Secondary | ICD-10-CM | POA: Diagnosis not present

## 2020-11-20 DIAGNOSIS — H524 Presbyopia: Secondary | ICD-10-CM | POA: Diagnosis not present

## 2020-11-20 DIAGNOSIS — H5213 Myopia, bilateral: Secondary | ICD-10-CM | POA: Diagnosis not present

## 2020-12-07 DIAGNOSIS — Z23 Encounter for immunization: Secondary | ICD-10-CM | POA: Diagnosis not present

## 2021-07-30 DIAGNOSIS — M9905 Segmental and somatic dysfunction of pelvic region: Secondary | ICD-10-CM | POA: Diagnosis not present

## 2021-07-30 DIAGNOSIS — M25552 Pain in left hip: Secondary | ICD-10-CM | POA: Diagnosis not present

## 2021-07-30 DIAGNOSIS — M9902 Segmental and somatic dysfunction of thoracic region: Secondary | ICD-10-CM | POA: Diagnosis not present

## 2021-07-30 DIAGNOSIS — M9903 Segmental and somatic dysfunction of lumbar region: Secondary | ICD-10-CM | POA: Diagnosis not present

## 2021-07-30 DIAGNOSIS — M5441 Lumbago with sciatica, right side: Secondary | ICD-10-CM | POA: Diagnosis not present

## 2021-08-30 DIAGNOSIS — L988 Other specified disorders of the skin and subcutaneous tissue: Secondary | ICD-10-CM | POA: Diagnosis not present

## 2021-08-30 DIAGNOSIS — D225 Melanocytic nevi of trunk: Secondary | ICD-10-CM | POA: Diagnosis not present

## 2021-08-30 DIAGNOSIS — X32XXXA Exposure to sunlight, initial encounter: Secondary | ICD-10-CM | POA: Diagnosis not present

## 2021-08-30 DIAGNOSIS — Z1283 Encounter for screening for malignant neoplasm of skin: Secondary | ICD-10-CM | POA: Diagnosis not present

## 2021-08-30 DIAGNOSIS — D485 Neoplasm of uncertain behavior of skin: Secondary | ICD-10-CM | POA: Diagnosis not present

## 2021-08-30 DIAGNOSIS — L570 Actinic keratosis: Secondary | ICD-10-CM | POA: Diagnosis not present

## 2021-09-24 DIAGNOSIS — M5441 Lumbago with sciatica, right side: Secondary | ICD-10-CM | POA: Diagnosis not present

## 2021-09-24 DIAGNOSIS — M9903 Segmental and somatic dysfunction of lumbar region: Secondary | ICD-10-CM | POA: Diagnosis not present

## 2021-09-24 DIAGNOSIS — M9905 Segmental and somatic dysfunction of pelvic region: Secondary | ICD-10-CM | POA: Diagnosis not present

## 2021-09-24 DIAGNOSIS — M25552 Pain in left hip: Secondary | ICD-10-CM | POA: Diagnosis not present

## 2021-09-24 DIAGNOSIS — M9902 Segmental and somatic dysfunction of thoracic region: Secondary | ICD-10-CM | POA: Diagnosis not present

## 2021-10-11 DIAGNOSIS — D485 Neoplasm of uncertain behavior of skin: Secondary | ICD-10-CM | POA: Diagnosis not present

## 2021-10-11 DIAGNOSIS — D225 Melanocytic nevi of trunk: Secondary | ICD-10-CM | POA: Diagnosis not present

## 2021-10-16 DIAGNOSIS — Z1231 Encounter for screening mammogram for malignant neoplasm of breast: Secondary | ICD-10-CM | POA: Diagnosis not present

## 2021-10-25 DIAGNOSIS — E782 Mixed hyperlipidemia: Secondary | ICD-10-CM | POA: Diagnosis not present

## 2021-10-25 DIAGNOSIS — R7301 Impaired fasting glucose: Secondary | ICD-10-CM | POA: Diagnosis not present

## 2021-11-07 DIAGNOSIS — N182 Chronic kidney disease, stage 2 (mild): Secondary | ICD-10-CM | POA: Diagnosis not present

## 2021-11-07 DIAGNOSIS — R7301 Impaired fasting glucose: Secondary | ICD-10-CM | POA: Diagnosis not present

## 2021-11-07 DIAGNOSIS — E782 Mixed hyperlipidemia: Secondary | ICD-10-CM | POA: Diagnosis not present

## 2021-11-07 DIAGNOSIS — M8580 Other specified disorders of bone density and structure, unspecified site: Secondary | ICD-10-CM | POA: Diagnosis not present

## 2021-11-07 DIAGNOSIS — G4700 Insomnia, unspecified: Secondary | ICD-10-CM | POA: Diagnosis not present

## 2021-11-07 DIAGNOSIS — I129 Hypertensive chronic kidney disease with stage 1 through stage 4 chronic kidney disease, or unspecified chronic kidney disease: Secondary | ICD-10-CM | POA: Diagnosis not present

## 2021-11-19 DIAGNOSIS — M9903 Segmental and somatic dysfunction of lumbar region: Secondary | ICD-10-CM | POA: Diagnosis not present

## 2021-11-19 DIAGNOSIS — M9905 Segmental and somatic dysfunction of pelvic region: Secondary | ICD-10-CM | POA: Diagnosis not present

## 2021-11-19 DIAGNOSIS — M25552 Pain in left hip: Secondary | ICD-10-CM | POA: Diagnosis not present

## 2021-11-19 DIAGNOSIS — M9902 Segmental and somatic dysfunction of thoracic region: Secondary | ICD-10-CM | POA: Diagnosis not present

## 2021-11-19 DIAGNOSIS — M5441 Lumbago with sciatica, right side: Secondary | ICD-10-CM | POA: Diagnosis not present

## 2021-11-20 DIAGNOSIS — H52203 Unspecified astigmatism, bilateral: Secondary | ICD-10-CM | POA: Diagnosis not present

## 2021-11-20 DIAGNOSIS — H25813 Combined forms of age-related cataract, bilateral: Secondary | ICD-10-CM | POA: Diagnosis not present

## 2021-11-20 DIAGNOSIS — H524 Presbyopia: Secondary | ICD-10-CM | POA: Diagnosis not present

## 2021-11-20 DIAGNOSIS — H5213 Myopia, bilateral: Secondary | ICD-10-CM | POA: Diagnosis not present

## 2022-01-14 DIAGNOSIS — M9902 Segmental and somatic dysfunction of thoracic region: Secondary | ICD-10-CM | POA: Diagnosis not present

## 2022-01-14 DIAGNOSIS — M62830 Muscle spasm of back: Secondary | ICD-10-CM | POA: Diagnosis not present

## 2022-01-14 DIAGNOSIS — M9903 Segmental and somatic dysfunction of lumbar region: Secondary | ICD-10-CM | POA: Diagnosis not present

## 2022-01-14 DIAGNOSIS — M9905 Segmental and somatic dysfunction of pelvic region: Secondary | ICD-10-CM | POA: Diagnosis not present

## 2022-02-14 DIAGNOSIS — D225 Melanocytic nevi of trunk: Secondary | ICD-10-CM | POA: Diagnosis not present

## 2022-02-14 DIAGNOSIS — Z1283 Encounter for screening for malignant neoplasm of skin: Secondary | ICD-10-CM | POA: Diagnosis not present

## 2022-03-11 DIAGNOSIS — M62830 Muscle spasm of back: Secondary | ICD-10-CM | POA: Diagnosis not present

## 2022-03-11 DIAGNOSIS — M9905 Segmental and somatic dysfunction of pelvic region: Secondary | ICD-10-CM | POA: Diagnosis not present

## 2022-03-11 DIAGNOSIS — M9903 Segmental and somatic dysfunction of lumbar region: Secondary | ICD-10-CM | POA: Diagnosis not present

## 2022-03-11 DIAGNOSIS — M9902 Segmental and somatic dysfunction of thoracic region: Secondary | ICD-10-CM | POA: Diagnosis not present

## 2022-05-02 DIAGNOSIS — E782 Mixed hyperlipidemia: Secondary | ICD-10-CM | POA: Diagnosis not present

## 2022-05-02 DIAGNOSIS — M8580 Other specified disorders of bone density and structure, unspecified site: Secondary | ICD-10-CM | POA: Diagnosis not present

## 2022-05-02 DIAGNOSIS — R7301 Impaired fasting glucose: Secondary | ICD-10-CM | POA: Diagnosis not present

## 2022-05-08 DIAGNOSIS — Z0000 Encounter for general adult medical examination without abnormal findings: Secondary | ICD-10-CM | POA: Diagnosis not present

## 2022-05-08 DIAGNOSIS — E782 Mixed hyperlipidemia: Secondary | ICD-10-CM | POA: Diagnosis not present

## 2022-05-08 DIAGNOSIS — M8580 Other specified disorders of bone density and structure, unspecified site: Secondary | ICD-10-CM | POA: Diagnosis not present

## 2022-05-08 DIAGNOSIS — N182 Chronic kidney disease, stage 2 (mild): Secondary | ICD-10-CM | POA: Diagnosis not present

## 2022-05-08 DIAGNOSIS — R7301 Impaired fasting glucose: Secondary | ICD-10-CM | POA: Diagnosis not present

## 2022-05-08 DIAGNOSIS — I129 Hypertensive chronic kidney disease with stage 1 through stage 4 chronic kidney disease, or unspecified chronic kidney disease: Secondary | ICD-10-CM | POA: Diagnosis not present

## 2022-05-08 DIAGNOSIS — G4700 Insomnia, unspecified: Secondary | ICD-10-CM | POA: Diagnosis not present

## 2022-05-08 DIAGNOSIS — E1122 Type 2 diabetes mellitus with diabetic chronic kidney disease: Secondary | ICD-10-CM | POA: Diagnosis not present

## 2022-06-06 DIAGNOSIS — M9905 Segmental and somatic dysfunction of pelvic region: Secondary | ICD-10-CM | POA: Diagnosis not present

## 2022-06-06 DIAGNOSIS — M9902 Segmental and somatic dysfunction of thoracic region: Secondary | ICD-10-CM | POA: Diagnosis not present

## 2022-06-06 DIAGNOSIS — M62830 Muscle spasm of back: Secondary | ICD-10-CM | POA: Diagnosis not present

## 2022-06-06 DIAGNOSIS — M9903 Segmental and somatic dysfunction of lumbar region: Secondary | ICD-10-CM | POA: Diagnosis not present

## 2022-08-28 DIAGNOSIS — M62830 Muscle spasm of back: Secondary | ICD-10-CM | POA: Diagnosis not present

## 2022-08-28 DIAGNOSIS — M9905 Segmental and somatic dysfunction of pelvic region: Secondary | ICD-10-CM | POA: Diagnosis not present

## 2022-08-28 DIAGNOSIS — M9902 Segmental and somatic dysfunction of thoracic region: Secondary | ICD-10-CM | POA: Diagnosis not present

## 2022-08-28 DIAGNOSIS — M9903 Segmental and somatic dysfunction of lumbar region: Secondary | ICD-10-CM | POA: Diagnosis not present

## 2022-09-02 DIAGNOSIS — M9905 Segmental and somatic dysfunction of pelvic region: Secondary | ICD-10-CM | POA: Diagnosis not present

## 2022-09-02 DIAGNOSIS — M9903 Segmental and somatic dysfunction of lumbar region: Secondary | ICD-10-CM | POA: Diagnosis not present

## 2022-09-02 DIAGNOSIS — M62830 Muscle spasm of back: Secondary | ICD-10-CM | POA: Diagnosis not present

## 2022-09-02 DIAGNOSIS — M9902 Segmental and somatic dysfunction of thoracic region: Secondary | ICD-10-CM | POA: Diagnosis not present

## 2022-10-09 DIAGNOSIS — M9903 Segmental and somatic dysfunction of lumbar region: Secondary | ICD-10-CM | POA: Diagnosis not present

## 2022-10-09 DIAGNOSIS — M9902 Segmental and somatic dysfunction of thoracic region: Secondary | ICD-10-CM | POA: Diagnosis not present

## 2022-10-09 DIAGNOSIS — M62830 Muscle spasm of back: Secondary | ICD-10-CM | POA: Diagnosis not present

## 2022-10-09 DIAGNOSIS — M9905 Segmental and somatic dysfunction of pelvic region: Secondary | ICD-10-CM | POA: Diagnosis not present

## 2022-10-22 DIAGNOSIS — Z1231 Encounter for screening mammogram for malignant neoplasm of breast: Secondary | ICD-10-CM | POA: Diagnosis not present

## 2022-10-22 DIAGNOSIS — M8580 Other specified disorders of bone density and structure, unspecified site: Secondary | ICD-10-CM | POA: Diagnosis not present

## 2022-10-22 DIAGNOSIS — M8588 Other specified disorders of bone density and structure, other site: Secondary | ICD-10-CM | POA: Diagnosis not present

## 2022-11-14 DIAGNOSIS — E559 Vitamin D deficiency, unspecified: Secondary | ICD-10-CM | POA: Diagnosis not present

## 2022-11-14 DIAGNOSIS — M8580 Other specified disorders of bone density and structure, unspecified site: Secondary | ICD-10-CM | POA: Diagnosis not present

## 2022-11-14 DIAGNOSIS — E782 Mixed hyperlipidemia: Secondary | ICD-10-CM | POA: Diagnosis not present

## 2022-11-14 DIAGNOSIS — R7301 Impaired fasting glucose: Secondary | ICD-10-CM | POA: Diagnosis not present

## 2022-11-20 DIAGNOSIS — G4700 Insomnia, unspecified: Secondary | ICD-10-CM | POA: Diagnosis not present

## 2022-11-20 DIAGNOSIS — M810 Age-related osteoporosis without current pathological fracture: Secondary | ICD-10-CM | POA: Diagnosis not present

## 2022-11-20 DIAGNOSIS — R7301 Impaired fasting glucose: Secondary | ICD-10-CM | POA: Diagnosis not present

## 2022-11-20 DIAGNOSIS — N182 Chronic kidney disease, stage 2 (mild): Secondary | ICD-10-CM | POA: Diagnosis not present

## 2022-11-20 DIAGNOSIS — E782 Mixed hyperlipidemia: Secondary | ICD-10-CM | POA: Diagnosis not present

## 2022-11-20 DIAGNOSIS — Z713 Dietary counseling and surveillance: Secondary | ICD-10-CM | POA: Diagnosis not present

## 2022-11-20 DIAGNOSIS — Z681 Body mass index (BMI) 19 or less, adult: Secondary | ICD-10-CM | POA: Diagnosis not present

## 2022-11-20 DIAGNOSIS — Z79899 Other long term (current) drug therapy: Secondary | ICD-10-CM | POA: Diagnosis not present

## 2022-11-20 DIAGNOSIS — I129 Hypertensive chronic kidney disease with stage 1 through stage 4 chronic kidney disease, or unspecified chronic kidney disease: Secondary | ICD-10-CM | POA: Diagnosis not present

## 2022-12-09 DIAGNOSIS — M62830 Muscle spasm of back: Secondary | ICD-10-CM | POA: Diagnosis not present

## 2022-12-09 DIAGNOSIS — M9903 Segmental and somatic dysfunction of lumbar region: Secondary | ICD-10-CM | POA: Diagnosis not present

## 2022-12-09 DIAGNOSIS — M9902 Segmental and somatic dysfunction of thoracic region: Secondary | ICD-10-CM | POA: Diagnosis not present

## 2022-12-09 DIAGNOSIS — M9905 Segmental and somatic dysfunction of pelvic region: Secondary | ICD-10-CM | POA: Diagnosis not present

## 2023-01-23 DIAGNOSIS — M8580 Other specified disorders of bone density and structure, unspecified site: Secondary | ICD-10-CM | POA: Diagnosis not present

## 2023-02-03 DIAGNOSIS — M9905 Segmental and somatic dysfunction of pelvic region: Secondary | ICD-10-CM | POA: Diagnosis not present

## 2023-02-03 DIAGNOSIS — M9902 Segmental and somatic dysfunction of thoracic region: Secondary | ICD-10-CM | POA: Diagnosis not present

## 2023-02-03 DIAGNOSIS — M62830 Muscle spasm of back: Secondary | ICD-10-CM | POA: Diagnosis not present

## 2023-02-03 DIAGNOSIS — M9903 Segmental and somatic dysfunction of lumbar region: Secondary | ICD-10-CM | POA: Diagnosis not present

## 2023-05-19 DIAGNOSIS — R7301 Impaired fasting glucose: Secondary | ICD-10-CM | POA: Diagnosis not present

## 2023-05-19 DIAGNOSIS — E782 Mixed hyperlipidemia: Secondary | ICD-10-CM | POA: Diagnosis not present

## 2023-05-22 DIAGNOSIS — G4700 Insomnia, unspecified: Secondary | ICD-10-CM | POA: Diagnosis not present

## 2023-05-22 DIAGNOSIS — N182 Chronic kidney disease, stage 2 (mild): Secondary | ICD-10-CM | POA: Diagnosis not present

## 2023-05-22 DIAGNOSIS — R7301 Impaired fasting glucose: Secondary | ICD-10-CM | POA: Diagnosis not present

## 2023-05-22 DIAGNOSIS — E782 Mixed hyperlipidemia: Secondary | ICD-10-CM | POA: Diagnosis not present

## 2023-05-22 DIAGNOSIS — I129 Hypertensive chronic kidney disease with stage 1 through stage 4 chronic kidney disease, or unspecified chronic kidney disease: Secondary | ICD-10-CM | POA: Diagnosis not present

## 2023-05-22 DIAGNOSIS — Z79899 Other long term (current) drug therapy: Secondary | ICD-10-CM | POA: Diagnosis not present

## 2023-05-22 DIAGNOSIS — M810 Age-related osteoporosis without current pathological fracture: Secondary | ICD-10-CM | POA: Diagnosis not present

## 2023-08-13 DIAGNOSIS — M810 Age-related osteoporosis without current pathological fracture: Secondary | ICD-10-CM | POA: Diagnosis not present

## 2023-10-24 DIAGNOSIS — Z1231 Encounter for screening mammogram for malignant neoplasm of breast: Secondary | ICD-10-CM | POA: Diagnosis not present
